# Patient Record
Sex: Female | Born: 1985 | Race: White | Hispanic: No | Marital: Single | Smoking: Never smoker
Health system: Southern US, Community
[De-identification: ages and names within clinical notes are randomized; demographics above are authoritative.]

## PROBLEM LIST (undated history)

## (undated) DIAGNOSIS — R269 Unspecified abnormalities of gait and mobility: Secondary | ICD-10-CM

## (undated) DIAGNOSIS — G43009 Migraine without aura, not intractable, without status migrainosus: Secondary | ICD-10-CM

## (undated) HISTORY — DX: Unspecified abnormalities of gait and mobility: R26.9

## (undated) HISTORY — PX: OTHER SURGICAL HISTORY: SHX169

## (undated) HISTORY — DX: Migraine without aura, not intractable, without status migrainosus: G43.009

## (undated) HISTORY — PX: NO PAST SURGERIES: SHX2092

---

## 2005-05-16 ENCOUNTER — Emergency Department (HOSPITAL_COMMUNITY): Admission: EM | Admit: 2005-05-16 | Discharge: 2005-05-16 | Payer: Self-pay | Admitting: Family Medicine

## 2014-01-21 ENCOUNTER — Encounter: Payer: Self-pay | Admitting: Neurology

## 2014-01-23 ENCOUNTER — Encounter (INDEPENDENT_AMBULATORY_CARE_PROVIDER_SITE_OTHER): Payer: Self-pay

## 2014-01-23 ENCOUNTER — Ambulatory Visit (INDEPENDENT_AMBULATORY_CARE_PROVIDER_SITE_OTHER): Payer: No Typology Code available for payment source | Admitting: Neurology

## 2014-01-23 ENCOUNTER — Encounter: Payer: Self-pay | Admitting: Neurology

## 2014-01-23 VITALS — BP 125/75 | HR 98 | Ht 64.0 in | Wt 147.0 lb

## 2014-01-23 DIAGNOSIS — R269 Unspecified abnormalities of gait and mobility: Secondary | ICD-10-CM | POA: Insufficient documentation

## 2014-01-23 DIAGNOSIS — R279 Unspecified lack of coordination: Secondary | ICD-10-CM

## 2014-01-23 DIAGNOSIS — R270 Ataxia, unspecified: Secondary | ICD-10-CM

## 2014-01-23 NOTE — Progress Notes (Signed)
Reason for visit: Gait ataxia  Karla Nichols is a 28 y.o. 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 The patient indicates that she has had some issues with walking, and multiple falls. The patient otherwise denies any numbness or weakness of extremities. The patient has not had any issues controlling the bowels or the bladder. The patient denies any double vision, loss of vision, hearing changes, or memory or concentration problems. The patient indicates that her father has a similar problem that has been gradually progressive. Her father has also been evaluated through this office, but blood work evaluations and MRI of the brain evaluation was never done on the father. The patient knows of no other family members that have been affected. The patient has no siblings. The patient comes to this office for further evaluation.  Past Medical History  Diagnosis Date  . Abnormality of gait   . Migraine without aura, without mention of intractable migraine without mention of status migrainosus     Past Surgical History  Procedure Laterality Date  . None      Family History  Problem Relation Age of Onset  . Hypertension Mother   . Diabetes Mother     Social history:  reports that she has never smoked. She has never used smokeless tobacco. She reports that she drinks alcohol. She reports that she does not use illicit drugs.  Medications:  Current Outpatient Prescriptions on File Prior to Visit  Medication Sig Dispense Refill  . cetirizine (ZYRTEC) 10 MG tablet Take 10 mg by mouth daily.       No current facility-administered medications on file prior to visit.     No Known Allergies  ROS:  Out of a complete 14 system review of symptoms, the patient complains only of the following symptoms, and all other reviewed systems are negative.  Gait  instability Allergies  Blood pressure 125/75, pulse 98, height 5\' 4"  (1.626 m), weight 147 lb (66.679 kg).  Physical Exam  General: The patient is alert and cooperative at the time of the examination.  Eyes: Pupils are equal, round, and reactive to light. Discs are flat bilaterally.  Neck: The neck is supple, no carotid bruits are noted.  Respiratory: The respiratory examination is clear.  Cardiovascular: The cardiovascular examination reveals a regular rate and rhythm, no obvious murmurs or rubs are noted.  Skin: Extremities are without significant edema.  Neurologic Exam  Mental status: The patient is alert and oriented x 3 at the time of the examination. The patient has apparent normal recent and remote memory, with an apparently normal attention span and concentration ability.  Cranial nerves: Facial symmetry is present. There is good sensation of the face to pinprick and soft touch bilaterally. The strength of the facial muscles and the muscles to head turning and shoulder shrug are normal bilaterally. Speech is abnormal, with an ataxic characteristic, no aphasia is noted. Extraocular movements are full, but the patient has prominent and gaze nystagmus. Visual fields are full. The tongue is midline, and the patient has symmetric elevation of the soft palate. No obvious hearing deficits are noted.  Motor: The motor testing reveals 5 over 5 strength of all 4 extremities. Good symmetric motor tone is noted throughout.  Sensory: Sensory testing is intact to pinprick, soft touch, and vibration sensation on all 4 extremities. Position sense is markedly  impaired on all 4 extremities. No evidence of extinction is noted.  Coordination: Cerebellar testing reveals good finger-nose-finger and heel-to-shin bilaterally.  Gait and station: Gait is wide-based, ataxic. Tandem gait is poor. Romberg is negative. No drift is seen.  Reflexes: Deep tendon reflexes are symmetric and normal  bilaterally. Toes are downgoing bilaterally.   Assessment/Plan:  1. 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 The clinical examination reveals evidence of posterior column dysfunction with significant position sense abnormalities, nystagmus, and a cerebellar speech pattern. SCA type 18 needs to be considered, but other forms of hereditary afferent ataxias should be considered such as ataxia with vitamin E deficiency (AVED), and Wilson's disease needs to be considered. The patient will be set up for MRI evaluation of the brain, and blood work today. Genetic testing may be offered. Unfortunately, insurance usually does not pay for genetic testing. 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 The offspring in the future for this patient may have a 50% chance of having the same disease. The patient will followup through this office if needed. The gait disorder is likely to be slowly progressive throughout her lifetime.  Marlan Palau. Keith Mahesh Sizemore MD 01/24/2014 5:41 PM  [facility] [address] [place], [place] [id number]  Phone [phone] Fax [phone]

## 2014-01-23 NOTE — Patient Instructions (Signed)

## 2016-06-08 ENCOUNTER — Encounter (HOSPITAL_COMMUNITY): Payer: Self-pay | Admitting: Emergency Medicine

## 2016-06-08 ENCOUNTER — Emergency Department (HOSPITAL_COMMUNITY)
Admission: EM | Admit: 2016-06-08 | Discharge: 2016-06-08 | Disposition: A | Discharge status: Home / Self Care | Payer: Self-pay | Attending: Dermatology | Admitting: Dermatology

## 2016-06-08 ENCOUNTER — Emergency Department (HOSPITAL_COMMUNITY): Payer: BLUE CROSS/BLUE SHIELD

## 2016-06-08 ENCOUNTER — Emergency Department (HOSPITAL_COMMUNITY)
Admission: EM | Admit: 2016-06-08 | Discharge: 2016-06-08 | Disposition: A | Discharge status: Home / Self Care | Payer: BLUE CROSS/BLUE SHIELD | Attending: Emergency Medicine | Admitting: Emergency Medicine

## 2016-06-08 DIAGNOSIS — S5292XA Unspecified fracture of left forearm, initial encounter for closed fracture: Secondary | ICD-10-CM

## 2016-06-08 DIAGNOSIS — Z5321 Procedure and treatment not carried out due to patient leaving prior to being seen by health care provider: Secondary | ICD-10-CM | POA: Insufficient documentation

## 2016-06-08 DIAGNOSIS — S52209A Unspecified fracture of shaft of unspecified ulna, initial encounter for closed fracture: Secondary | ICD-10-CM

## 2016-06-08 DIAGNOSIS — Y939 Activity, unspecified: Secondary | ICD-10-CM | POA: Insufficient documentation

## 2016-06-08 DIAGNOSIS — S4992XA Unspecified injury of left shoulder and upper arm, initial encounter: Secondary | ICD-10-CM | POA: Insufficient documentation

## 2016-06-08 DIAGNOSIS — Z791 Long term (current) use of non-steroidal anti-inflammatories (NSAID): Secondary | ICD-10-CM | POA: Insufficient documentation

## 2016-06-08 DIAGNOSIS — M79632 Pain in left forearm: Secondary | ICD-10-CM | POA: Insufficient documentation

## 2016-06-08 DIAGNOSIS — X58XXXA Exposure to other specified factors, initial encounter: Secondary | ICD-10-CM | POA: Insufficient documentation

## 2016-06-08 DIAGNOSIS — S52202A Unspecified fracture of shaft of left ulna, initial encounter for closed fracture: Secondary | ICD-10-CM

## 2016-06-08 DIAGNOSIS — Y999 Unspecified external cause status: Secondary | ICD-10-CM | POA: Insufficient documentation

## 2016-06-08 DIAGNOSIS — Y929 Unspecified place or not applicable: Secondary | ICD-10-CM | POA: Insufficient documentation

## 2016-06-08 DIAGNOSIS — S5290XA Unspecified fracture of unspecified forearm, initial encounter for closed fracture: Secondary | ICD-10-CM

## 2016-06-08 MED ORDER — HYDROCODONE-ACETAMINOPHEN 5-325 MG PO TABS
1.0000 | ORAL_TABLET | Freq: Once | ORAL | Status: AC
  Administered 2016-06-08: 1 via ORAL
  Filled 2016-06-08: qty 1
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
## 2016-06-08 NOTE — ED Notes (Signed)
Ice pack given to patient for left arm swelling.

## 2016-06-08 NOTE — Discharge Instructions (Signed)
Ice and elevation of your left arm for swelling. Take 3-4 over-the-counter Motrin 2 to 3 times a day with meals to help with pain and inflammation. Keep your splint clean and dry. Dr. Magnus IvanBlackman will call you to schedule surgery.  His number is [phone] [facility]
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
## 2016-06-10 ENCOUNTER — Encounter (HOSPITAL_COMMUNITY): Payer: Self-pay | Admitting: *Deleted

## 2016-06-10 NOTE — Progress Notes (Signed)
Patient denied chest pain, shortness of breath, cardiology visit. Patient verbalized understanding of all instructions.

## 2016-06-11 ENCOUNTER — Encounter (HOSPITAL_COMMUNITY): Payer: Self-pay | Admitting: Emergency Medicine

## 2016-06-13 ENCOUNTER — Ambulatory Visit (HOSPITAL_COMMUNITY): Payer: BLUE CROSS/BLUE SHIELD

## 2016-06-13 ENCOUNTER — Encounter (HOSPITAL_COMMUNITY): Payer: Self-pay | Admitting: *Deleted

## 2016-06-13 ENCOUNTER — Ambulatory Visit (HOSPITAL_COMMUNITY): Payer: BLUE CROSS/BLUE SHIELD | Admitting: Certified Registered Nurse Anesthetist

## 2016-06-13 ENCOUNTER — Encounter (HOSPITAL_COMMUNITY)
Admission: RE | Disposition: A | Discharge status: Home / Self Care | Payer: Self-pay | Source: Ambulatory Visit | Attending: Orthopaedic Surgery

## 2016-06-13 ENCOUNTER — Other Ambulatory Visit: Payer: Self-pay | Admitting: Orthopaedic Surgery

## 2016-06-13 ENCOUNTER — Observation Stay (HOSPITAL_COMMUNITY)
Admission: RE | Admit: 2016-06-13 | Discharge: 2016-06-14 | Disposition: A | Discharge status: Home / Self Care | Payer: BLUE CROSS/BLUE SHIELD | Source: Ambulatory Visit | Attending: Orthopaedic Surgery | Admitting: Orthopaedic Surgery

## 2016-06-13 DIAGNOSIS — S52209A Unspecified fracture of shaft of unspecified ulna, initial encounter for closed fracture: Secondary | ICD-10-CM | POA: Diagnosis present

## 2016-06-13 DIAGNOSIS — S52322A Displaced transverse fracture of shaft of left radius, initial encounter for closed fracture: Secondary | ICD-10-CM | POA: Diagnosis present

## 2016-06-13 DIAGNOSIS — S59092A Other physeal fracture of lower end of ulna, left arm, initial encounter for closed fracture: Secondary | ICD-10-CM | POA: Diagnosis not present

## 2016-06-13 DIAGNOSIS — S5290XA Unspecified fracture of unspecified forearm, initial encounter for closed fracture: Secondary | ICD-10-CM

## 2016-06-13 DIAGNOSIS — S52202A Unspecified fracture of shaft of left ulna, initial encounter for closed fracture: Secondary | ICD-10-CM

## 2016-06-13 DIAGNOSIS — S5292XA Unspecified fracture of left forearm, initial encounter for closed fracture: Secondary | ICD-10-CM

## 2016-06-13 HISTORY — PX: ORIF ULNAR FRACTURE: SHX5417

## 2016-06-13 HISTORY — DX: Unspecified abnormalities of gait and mobility: R26.9

## 2016-06-13 LAB — HCG, SERUM, QUALITATIVE: Preg, Serum: NEGATIVE

## 2016-06-13 SURGERY — OPEN REDUCTION INTERNAL FIXATION (ORIF) ULNAR FRACTURE
Anesthesia: Regional | Site: Hand | Laterality: Left

## 2016-06-13 MED ORDER — MIDAZOLAM HCL 2 MG/2ML IJ SOLN
INTRAMUSCULAR | Status: AC
  Administered 2016-06-13: 2 mg via INTRAVENOUS
  Filled 2016-06-13: qty 2

## 2016-06-13 MED ORDER — ONDANSETRON HCL 4 MG PO TABS: 4.0000 mg | ORAL_TABLET | Freq: Four times a day (QID) | ORAL | Status: DC | PRN

## 2016-06-13 MED ORDER — MIDAZOLAM HCL 2 MG/2ML IJ SOLN
2.0000 mg | Freq: Once | INTRAMUSCULAR | Status: AC
  Administered 2016-06-13: 2 mg via INTRAVENOUS

## 2016-06-13 MED ORDER — FENTANYL CITRATE (PF) 250 MCG/5ML IJ SOLN
INTRAMUSCULAR | Status: AC
  Filled 2016-06-13: qty 5

## 2016-06-13 MED ORDER — FENTANYL CITRATE (PF) 100 MCG/2ML IJ SOLN
INTRAMUSCULAR | Status: AC
  Administered 2016-06-13: 100 ug via INTRAVENOUS
  Filled 2016-06-13: qty 2

## 2016-06-13 MED ORDER — ONDANSETRON HCL 4 MG/2ML IJ SOLN
INTRAMUSCULAR | Status: DC | PRN
  Administered 2016-06-13: 4 mg via INTRAVENOUS

## 2016-06-13 MED ORDER — HYDROMORPHONE HCL 1 MG/ML IJ SOLN: 0.5000 mg | INTRAMUSCULAR | Status: DC | PRN

## 2016-06-13 MED ORDER — OXYCODONE HCL 5 MG PO TABS
5.0000 mg | ORAL_TABLET | ORAL | Status: DC | PRN
  Administered 2016-06-13 – 2016-06-14 (×2): 10 mg via ORAL
  Administered 2016-06-14: 5 mg via ORAL
  Filled 2016-06-13 (×3): qty 2

## 2016-06-13 MED ORDER — FENTANYL CITRATE (PF) 100 MCG/2ML IJ SOLN: 25.0000 ug | INTRAMUSCULAR | Status: DC | PRN

## 2016-06-13 MED ORDER — LACTATED RINGERS IV SOLN
INTRAVENOUS | Status: DC
  Administered 2016-06-13 (×3): via INTRAVENOUS

## 2016-06-13 MED ORDER — DIPHENHYDRAMINE HCL 12.5 MG/5ML PO ELIX: 12.5000 mg | ORAL_SOLUTION | ORAL | Status: DC | PRN

## 2016-06-13 MED ORDER — LIDOCAINE 2% (20 MG/ML) 5 ML SYRINGE
INTRAMUSCULAR | Status: AC
  Filled 2016-06-13: qty 5

## 2016-06-13 MED ORDER — FENTANYL CITRATE (PF) 100 MCG/2ML IJ SOLN
INTRAMUSCULAR | Status: DC | PRN
  Administered 2016-06-13: 50 ug via INTRAVENOUS
  Administered 2016-06-13: 25 ug via INTRAVENOUS

## 2016-06-13 MED ORDER — FENTANYL CITRATE (PF) 100 MCG/2ML IJ SOLN
100.0000 ug | Freq: Once | INTRAMUSCULAR | Status: AC
  Administered 2016-06-13: 100 ug via INTRAVENOUS

## 2016-06-13 MED ORDER — ROCURONIUM BROMIDE 50 MG/5ML IV SOLN
INTRAVENOUS | Status: AC
  Filled 2016-06-13: qty 2

## 2016-06-13 MED ORDER — 0.9 % SODIUM CHLORIDE (POUR BTL) OPTIME
TOPICAL | Status: DC | PRN
  Administered 2016-06-13: 1000 mL

## 2016-06-13 MED ORDER — LIDOCAINE HCL (CARDIAC) 20 MG/ML IV SOLN
INTRAVENOUS | Status: DC | PRN
  Administered 2016-06-13: 100 mg via INTRAVENOUS

## 2016-06-13 MED ORDER — ACETAMINOPHEN 325 MG PO TABS: 650.0000 mg | ORAL_TABLET | Freq: Four times a day (QID) | ORAL | Status: DC | PRN

## 2016-06-13 MED ORDER — MIDAZOLAM HCL 2 MG/2ML IJ SOLN
INTRAMUSCULAR | Status: AC
  Filled 2016-06-13: qty 2

## 2016-06-13 MED ORDER — CEFAZOLIN IN D5W 1 GM/50ML IV SOLN
1.0000 g | Freq: Four times a day (QID) | INTRAVENOUS | Status: AC
Start: 2016-06-13 — End: 2016-06-14
  Administered 2016-06-13 – 2016-06-14 (×3): 1 g via INTRAVENOUS
  Filled 2016-06-13 (×3): qty 50

## 2016-06-13 MED ORDER — DEXAMETHASONE SODIUM PHOSPHATE 4 MG/ML IJ SOLN
INTRAMUSCULAR | Status: DC | PRN
  Administered 2016-06-13: 10 mg via INTRAVENOUS

## 2016-06-13 MED ORDER — PROPOFOL 10 MG/ML IV BOLUS
INTRAVENOUS | Status: DC | PRN
  Administered 2016-06-13: 200 mg via INTRAVENOUS

## 2016-06-13 MED ORDER — METOCLOPRAMIDE HCL 5 MG/ML IJ SOLN: 5.0000 mg | Freq: Three times a day (TID) | INTRAMUSCULAR | Status: DC | PRN

## 2016-06-13 MED ORDER — ONDANSETRON HCL 4 MG/2ML IJ SOLN: 4.0000 mg | Freq: Four times a day (QID) | INTRAMUSCULAR | Status: DC | PRN

## 2016-06-13 MED ORDER — CEFAZOLIN SODIUM-DEXTROSE 2-4 GM/100ML-% IV SOLN
2.0000 g | INTRAVENOUS | Status: AC
  Administered 2016-06-13: 2 g via INTRAVENOUS

## 2016-06-13 MED ORDER — CEFAZOLIN SODIUM-DEXTROSE 2-4 GM/100ML-% IV SOLN
INTRAVENOUS | Status: AC
  Filled 2016-06-13: qty 100

## 2016-06-13 MED ORDER — KETOROLAC TROMETHAMINE 15 MG/ML IJ SOLN
7.5000 mg | Freq: Four times a day (QID) | INTRAMUSCULAR | Status: DC
  Administered 2016-06-13: 7.5 mg via INTRAVENOUS
  Filled 2016-06-13: qty 1

## 2016-06-13 MED ORDER — METHOCARBAMOL 1000 MG/10ML IJ SOLN
500.0000 mg | Freq: Four times a day (QID) | INTRAVENOUS | Status: DC | PRN
  Filled 2016-06-13: qty 5

## 2016-06-13 MED ORDER — LORATADINE 10 MG PO TABS
10.0000 mg | ORAL_TABLET | Freq: Every day | ORAL | Status: DC
  Administered 2016-06-14: 10 mg via ORAL
  Filled 2016-06-13: qty 1

## 2016-06-13 MED ORDER — BUPIVACAINE-EPINEPHRINE (PF) 0.5% -1:200000 IJ SOLN
INTRAMUSCULAR | Status: DC | PRN
  Administered 2016-06-13: 25 mL

## 2016-06-13 MED ORDER — ACETAMINOPHEN 650 MG RE SUPP: 650.0000 mg | Freq: Four times a day (QID) | RECTAL | Status: DC | PRN

## 2016-06-13 MED ORDER — SODIUM CHLORIDE 0.9 % IV SOLN: INTRAVENOUS | Status: DC

## 2016-06-13 MED ORDER — METOCLOPRAMIDE HCL 5 MG PO TABS: 5.0000 mg | ORAL_TABLET | Freq: Three times a day (TID) | ORAL | Status: DC | PRN

## 2016-06-13 MED ORDER — METHOCARBAMOL 500 MG PO TABS
500.0000 mg | ORAL_TABLET | Freq: Four times a day (QID) | ORAL | Status: DC | PRN
Start: 2016-06-13 — End: 2016-06-14

## 2016-06-13 MED ORDER — PROMETHAZINE HCL 25 MG/ML IJ SOLN: 6.2500 mg | INTRAMUSCULAR | Status: DC | PRN

## 2016-06-13 SURGICAL SUPPLY — 68 items
BANDAGE ACE 3X5.8 VEL STRL LF (GAUZE/BANDAGES/DRESSINGS) ×3 IMPLANT
BANDAGE ACE 4X5 VEL STRL LF (GAUZE/BANDAGES/DRESSINGS) ×3 IMPLANT
BANDAGE ELASTIC 4 VELCRO ST LF (GAUZE/BANDAGES/DRESSINGS) ×3 IMPLANT
BIT DRILL 2.6 (BIT) ×3 IMPLANT
BNDG ESMARK 4X9 LF (GAUZE/BANDAGES/DRESSINGS) ×3 IMPLANT
BNDG GAUZE ELAST 4 BULKY (GAUZE/BANDAGES/DRESSINGS) ×3 IMPLANT
CLOSURE STERI-STRIP 1/2X4 (GAUZE/BANDAGES/DRESSINGS) ×1
CLOSURE WOUND 1/2 X4 (GAUZE/BANDAGES/DRESSINGS) ×1
CLSR STERI-STRIP ANTIMIC 1/2X4 (GAUZE/BANDAGES/DRESSINGS) ×2 IMPLANT
CORDS BIPOLAR (ELECTRODE) ×3 IMPLANT
COVER SURGICAL LIGHT HANDLE (MISCELLANEOUS) ×3 IMPLANT
CUFF TOURNIQUET SINGLE 18IN (TOURNIQUET CUFF) ×3 IMPLANT
CUFF TOURNIQUET SINGLE 24IN (TOURNIQUET CUFF) IMPLANT
DRAPE OEC MINIVIEW 54X84 (DRAPES) ×3 IMPLANT
DRAPE U-SHAPE 47X51 STRL (DRAPES) ×3 IMPLANT
DURAPREP 26ML APPLICATOR (WOUND CARE) ×3 IMPLANT
ELECT REM PT RETURN 9FT ADLT (ELECTROSURGICAL) ×3
ELECTRODE REM PT RTRN 9FT ADLT (ELECTROSURGICAL) ×1 IMPLANT
GAUZE SPONGE 4X4 12PLY STRL (GAUZE/BANDAGES/DRESSINGS) ×3 IMPLANT
GAUZE XEROFORM 1X8 LF (GAUZE/BANDAGES/DRESSINGS) ×3 IMPLANT
GLOVE BIO SURGEON STRL SZ 6.5 (GLOVE) ×4 IMPLANT
GLOVE BIO SURGEON STRL SZ8 (GLOVE) ×3 IMPLANT
GLOVE BIO SURGEONS STRL SZ 6.5 (GLOVE) ×2
GLOVE BIOGEL PI IND STRL 6.5 (GLOVE) ×2 IMPLANT
GLOVE BIOGEL PI IND STRL 8 (GLOVE) ×1 IMPLANT
GLOVE BIOGEL PI INDICATOR 6.5 (GLOVE) ×4
GLOVE BIOGEL PI INDICATOR 8 (GLOVE) ×2
GLOVE ORTHO TXT STRL SZ7.5 (GLOVE) ×3 IMPLANT
GOWN STRL REUS W/ TWL LRG LVL3 (GOWN DISPOSABLE) ×1 IMPLANT
GOWN STRL REUS W/ TWL XL LVL3 (GOWN DISPOSABLE) ×4 IMPLANT
GOWN STRL REUS W/TWL LRG LVL3 (GOWN DISPOSABLE) ×2
GOWN STRL REUS W/TWL XL LVL3 (GOWN DISPOSABLE) ×8
KIT BASIN OR (CUSTOM PROCEDURE TRAY) ×3 IMPLANT
KIT ROOM TURNOVER OR (KITS) ×3 IMPLANT
MANIFOLD NEPTUNE II (INSTRUMENTS) ×3 IMPLANT
NEEDLE 22X1 1/2 (OR ONLY) (NEEDLE) IMPLANT
NS IRRIG 1000ML POUR BTL (IV SOLUTION) ×3 IMPLANT
PACK ORTHO EXTREMITY (CUSTOM PROCEDURE TRAY) ×3 IMPLANT
PAD ARMBOARD 7.5X6 YLW CONV (MISCELLANEOUS) ×6 IMPLANT
PAD CAST 4YDX4 CTTN HI CHSV (CAST SUPPLIES) ×1 IMPLANT
PADDING CAST COTTON 4X4 STRL (CAST SUPPLIES) ×2
PLATE COMP NARROW STRT 6H 78MM (Plate) ×3 IMPLANT
PLATE COMP NARROW STRT 7H/90MM (Plate) ×3 IMPLANT
SCREW BONE 3.5X12 (Screw) ×15 IMPLANT
SCREW BONE 3.5X14MM (Screw) ×21 IMPLANT
SCREW BONE 3.5X16MM (Screw) ×3 IMPLANT
SPLINT FIBERGLASS 3X35 (CAST SUPPLIES) ×3 IMPLANT
SPONGE GAUZE 4X4 12PLY STER LF (GAUZE/BANDAGES/DRESSINGS) ×3 IMPLANT
SPONGE LAP 4X18 X RAY DECT (DISPOSABLE) ×3 IMPLANT
STRIP CLOSURE SKIN 1/2X4 (GAUZE/BANDAGES/DRESSINGS) ×2 IMPLANT
SUCTION FRAZIER HANDLE 10FR (MISCELLANEOUS) ×2
SUCTION TUBE FRAZIER 10FR DISP (MISCELLANEOUS) ×1 IMPLANT
SUT ETHILON 3 0 PS 1 (SUTURE) ×9 IMPLANT
SUT PROLENE 3 0 PS 1 (SUTURE) ×3 IMPLANT
SUT VIC AB 0 CT1 27 (SUTURE) ×4
SUT VIC AB 0 CT1 27XBRD ANBCTR (SUTURE) ×2 IMPLANT
SUT VIC AB 2-0 CT1 27 (SUTURE) ×4
SUT VIC AB 2-0 CT1 TAPERPNT 27 (SUTURE) ×2 IMPLANT
SUT VIC AB 3-0 X1 27 (SUTURE) ×3 IMPLANT
SUT VICRYL 4-0 PS2 18IN ABS (SUTURE) ×3 IMPLANT
SYR CONTROL 10ML LL (SYRINGE) IMPLANT
SYSTEM CHEST DRAIN TLS 7FR (DRAIN) ×3 IMPLANT
TOWEL OR 17X24 6PK STRL BLUE (TOWEL DISPOSABLE) ×3 IMPLANT
TOWEL OR 17X26 10 PK STRL BLUE (TOWEL DISPOSABLE) ×3 IMPLANT
TUBE CONNECTING 12'X1/4 (SUCTIONS) ×1
TUBE CONNECTING 12X1/4 (SUCTIONS) ×2 IMPLANT
UNDERPAD 30X30 INCONTINENT (UNDERPADS AND DIAPERS) ×3 IMPLANT
WATER STERILE IRR 1000ML POUR (IV SOLUTION) ×3 IMPLANT
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
## 2016-06-13 NOTE — Anesthesia Procedure Notes (Signed)
Procedure Name: LMA Insertion Date/Time: 06/13/2016 3:37 PM Performed by: Reine Just Pre-anesthesia Checklist: Patient identified, Emergency Drugs available, Suction available, Patient being monitored and Timeout performed Patient Re-evaluated:Patient Re-evaluated prior to inductionOxygen Delivery Method: Circle system utilized and Simple face mask Preoxygenation: Pre-oxygenation with 100% oxygen Intubation Type: IV induction Ventilation: Mask ventilation without difficulty LMA: LMA inserted LMA Size: 4.0 Number of attempts: 1 Airway Equipment and Method: Patient positioned with wedge pillow Placement Confirmation: breath sounds checked- equal and bilateral and positive ETCO2 Tube secured with: Tape Dental Injury: Teeth and Oropharynx as per pre-operative assessment

## 2016-06-13 NOTE — Anesthesia Procedure Notes (Signed)
Anesthesia Regional Block:  Supraclavicular block  Pre-Anesthetic Checklist: ,, timeout performed, Correct Patient, Correct Site, Correct Laterality, Correct Procedure, Correct Position, site marked, Risks and benefits discussed,  Surgical consent,  Pre-op evaluation,  At surgeon's request and post-op pain management  Laterality: Left  Prep: chloraprep       Needles:  Injection technique: Single-shot  Needle Type: Stimiplex     Needle Length: 9cm 9 cm Needle Gauge: 21 G    Additional Needles:  Procedures: ultrasound guided (picture in chart) Supraclavicular block Narrative:  Injection made incrementally with aspirations every 5 mL.  Performed by: Personally  Anesthesiologist: Chamberlain Steinborn  Additional Notes: Risks, benefits and alternative to block explained extensively.  Patient tolerated procedure well, without complications.      

## 2016-06-13 NOTE — Anesthesia Preprocedure Evaluation (Signed)
Anesthesia Evaluation  Patient identified by MRN, date of birth, ID band Patient awake    Reviewed: Allergy & Precautions, H&P , NPO status , Patient's Chart, lab work & pertinent test results  History of Anesthesia Complications Negative for: history of anesthetic complications  Airway Mallampati: II  TM Distance: >3 FB Neck ROM: full    Dental no notable dental hx.    Pulmonary neg pulmonary ROS,    Pulmonary exam normal breath sounds clear to auscultation       Cardiovascular negative cardio ROS Normal cardiovascular exam Rhythm:regular Rate:Normal     Neuro/Psych  Headaches,    GI/Hepatic negative GI ROS, Neg liver ROS,   Endo/Other  negative endocrine ROS  Renal/GU negative Renal ROS     Musculoskeletal   Abdominal   Peds  Hematology negative hematology ROS (+)   Anesthesia Other Findings   Reproductive/Obstetrics negative OB ROS                             Anesthesia Physical Anesthesia Plan  ASA: II  Anesthesia Plan: General and Regional   Post-op Pain Management:    Induction: Intravenous  Airway Management Planned: LMA  Additional Equipment:   Intra-op Plan:   Post-operative Plan: Extubation in OR  Informed Consent: I have reviewed the patients History and Physical, chart, labs and discussed the procedure including the risks, benefits and alternatives for the proposed anesthesia with the patient or authorized representative who has indicated his/her understanding and acceptance.   Dental Advisory Given  Plan Discussed with: Anesthesiologist, CRNA and Surgeon  Anesthesia Plan Comments:         Anesthesia Quick Evaluation
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
## 2016-06-13 NOTE — Transfer of Care (Signed)
Immediate Anesthesia Transfer of Care Note  Patient: Karla Nichols  Procedure(s) Performed: Procedure(s): OPEN REDUCTION INTERNAL FIXATION (ORIF) LEFT RADIUS AND ULNA FRACTURES (Left)  Patient Location: PACU  Anesthesia Type:GA combined with regional for post-op pain  Level of Consciousness: awake and alert   Airway & Oxygen Therapy: Patient Spontanous Breathing and Patient connected to nasal cannula oxygen  Post-op Assessment: Report given to RN and Post -op Vital signs reviewed and stable  Post vital signs: Reviewed and stable  Last Vitals:  Vitals:   06/13/16 1446 06/13/16 1447  BP: 139/68   Pulse: (!) 108 (!) 114  Resp: 14 19  Temp:      Last Pain:  Vitals:   06/13/16 1311  TempSrc: Oral         Complications: No apparent anesthesia complications

## 2016-06-14 ENCOUNTER — Encounter (HOSPITAL_COMMUNITY): Payer: Self-pay | Admitting: Orthopaedic Surgery

## 2016-06-14 DIAGNOSIS — S52322A Displaced transverse fracture of shaft of left radius, initial encounter for closed fracture: Secondary | ICD-10-CM | POA: Diagnosis not present

## 2016-06-14 MED ORDER — KETOROLAC TROMETHAMINE 15 MG/ML IJ SOLN: 7.5000 mg | Freq: Four times a day (QID) | INTRAMUSCULAR | Status: DC

## 2016-06-14 MED ORDER — OXYCODONE-ACETAMINOPHEN 5-325 MG PO TABS: 1.0000 | ORAL_TABLET | ORAL | 0 refills | Status: DC | PRN

## 2016-06-14 NOTE — Progress Notes (Signed)
Orthopedic Tech Progress Note Patient Details:  Karla Nichols 02-05-1986 [id number]  Ortho Devices Type of Ortho Device: Velcro wrist forearm splint Ortho Device/Splint Location: lue Ortho Device/Splint Interventions: Application   Maalik Pinn 06/14/2016, 9:06 AM

## 2016-06-14 NOTE — Progress Notes (Signed)
Patient ID: Karla Nichols, female   DOB: February 28, 1986, 30 y.o.   MRN: [id number] Doing well.  Can be discharged to home today.  Will need velcro forearm splint

## 2016-06-14 NOTE — Progress Notes (Signed)
Discharge instructions given. Pt verbalized understanding and all questions were answered.  
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
## 2016-06-14 NOTE — Discharge Instructions (Signed)
Use your left arm as comfort allows. You can get your current dressing wet daily in the shower. Wear your velcro arm splint through out the day, but remove it to shower. No lifting greater than 5 lbs with your left arm.

## 2016-06-15 NOTE — Anesthesia Postprocedure Evaluation (Signed)
Anesthesia Post Note  Patient: Karla Nichols  Procedure(s) Performed: Procedure(s) (LRB): OPEN REDUCTION INTERNAL FIXATION (ORIF) LEFT RADIUS AND ULNA FRACTURES (Left)  Patient location during evaluation: PACU Anesthesia Type: General and Regional Level of consciousness: awake Pain management: pain level controlled Vital Signs Assessment: post-procedure vital signs reviewed and stable Respiratory status: spontaneous breathing Cardiovascular status: stable Postop Assessment: no signs of nausea or vomiting Anesthetic complications: no    Last Vitals:  Vitals:   06/13/16 2134 06/14/16 1300  BP: (!) 100/52 (!) 143/67  Pulse: (!) 110 89  Resp: 16 16  Temp: 37.2 C 37.3 C    Last Pain:  Vitals:   06/14/16 1300  TempSrc: Oral  PainSc:                  Furious Chiarelli

## 2016-08-24 ENCOUNTER — Ambulatory Visit (INDEPENDENT_AMBULATORY_CARE_PROVIDER_SITE_OTHER): Payer: BLUE CROSS/BLUE SHIELD | Admitting: Orthopaedic Surgery

## 2016-08-24 DIAGNOSIS — S52225D Nondisplaced transverse fracture of shaft of left ulna, subsequent encounter for closed fracture with routine healing: Secondary | ICD-10-CM

## 2016-09-21 ENCOUNTER — Ambulatory Visit (INDEPENDENT_AMBULATORY_CARE_PROVIDER_SITE_OTHER): Payer: BLUE CROSS/BLUE SHIELD | Admitting: Orthopaedic Surgery

## 2016-09-21 ENCOUNTER — Ambulatory Visit (INDEPENDENT_AMBULATORY_CARE_PROVIDER_SITE_OTHER): Payer: BLUE CROSS/BLUE SHIELD

## 2016-09-21 DIAGNOSIS — S52225D Nondisplaced transverse fracture of shaft of left ulna, subsequent encounter for closed fracture with routine healing: Secondary | ICD-10-CM

## 2016-09-21 NOTE — Progress Notes (Signed)
   Office Visit Note   Patient: Karla Nichols           Date of Birth: 12/19/1985           MRN: [id number] Visit Date: 09/21/2016              Requested by: Farris HasAaron [place], MD [address] [place], [place] [address] PCP: Farris HasMORROW, AARON, MD   Assessment & Plan: Visit Diagnoses:  1. Closed nondisplaced transverse fracture of shaft of left ulna with routine healing     Plan: She is almost essentially pain-free. She is return to work duties without restrictions starting November 20. She doesn't lift anything greater than 25 pounds anyway at work. I would like to see her back in 3 months with repeat AP lateral of her left forearm given the fact that I still see a small gap in the fractured radius.  Follow-Up Instructions: Return in about 3 months (around 12/22/2016).   Orders:  Orders Placed This Encounter  Procedures  . XR Forearm Left   No orders of the defined types were placed in this encounter.     Procedures: No procedures performed   Clinical Data: No additional findings.   Subjective: Chief Complaint  Patient presents with  . Left Wrist - Follow-up    Followup ORIF Left BB Forearm fracture. Patient is doing better, ROM and strength is increasing.    HPI  Review of Systems   Objective: Vital Signs: There were no vitals taken for this visit.  Physical Exam  Ortho Exam She has full range of motion of her left elbow and left wrist. There is no significant pain at the fracture site at all. Specialty Comments:  No specialty comments available.  Imaging: Xr Forearm Left  Result Date: 09/21/2016 AP and lateral left forearm shows the hardware is intact. There is no evidence of loosening or hardware failure. The ulnar fracture is almost completely healed. The radius fracture still shows a slight gap in the fracture.    PMFS History: Patient Active Problem List   Diagnosis Date Noted  . Forearm fractures, both bones, closed 06/08/2016  .  Abnormality of gait 01/23/2014   Past Medical History:  Diagnosis Date  . Abnormality of gait   . Gait abnormality   . Migraine without aura, without mention of intractable migraine without mention of status migrainosus     Family History  Problem Relation Age of Onset  . Hypertension Mother   . Diabetes Mother     Past Surgical History:  Procedure Laterality Date  . NO PAST SURGERIES    . none    . ORIF ULNAR FRACTURE Left 06/13/2016   Procedure: OPEN REDUCTION INTERNAL FIXATION (ORIF) LEFT RADIUS AND ULNA FRACTURES;  Surgeon: Kathryne Hitchhristopher Y Blackman, MD;  Location: [facility] OR;  Service: Orthopedics;  Laterality: Left;   Social History   Occupational History  . cashier [employer]   Social History Main Topics  . Smoking status: Never Smoker  . Smokeless tobacco: Never Used  . Alcohol use No     Comment: occassional  . Drug use: No  . Sexual activity: Yes    Birth control/ protection: None

## 2016-12-25 ENCOUNTER — Ambulatory Visit (INDEPENDENT_AMBULATORY_CARE_PROVIDER_SITE_OTHER): Payer: BLUE CROSS/BLUE SHIELD | Admitting: Orthopaedic Surgery

## 2016-12-25 ENCOUNTER — Encounter (INDEPENDENT_AMBULATORY_CARE_PROVIDER_SITE_OTHER): Payer: Self-pay | Admitting: Orthopaedic Surgery

## 2016-12-25 ENCOUNTER — Ambulatory Visit (INDEPENDENT_AMBULATORY_CARE_PROVIDER_SITE_OTHER): Payer: Self-pay

## 2016-12-25 DIAGNOSIS — S52202D Unspecified fracture of shaft of left ulna, subsequent encounter for closed fracture with routine healing: Secondary | ICD-10-CM | POA: Diagnosis not present

## 2016-12-25 DIAGNOSIS — S5292XD Unspecified fracture of left forearm, subsequent encounter for closed fracture with routine healing: Secondary | ICD-10-CM | POA: Diagnosis not present

## 2016-12-25 NOTE — Progress Notes (Signed)
The patient is now 6 months status post open reduction internal fixation of a left both bone forearm fracture. I want to see her back to regular concerned about the fact that the bone was appearing to not a field completely at her 3 month follow-up. She is now 6 months out from this injury and surgery. She denies any pain in her left forearm at all.  On examination of left forearm her scars are prominent from her fair complexion but overall the range of motion of her left elbow and left wrist are entirely full. She has 5 out of 5 strength of wrist flexion and wrist extension as well as excellent grip strength. She denies any pain when I stress the fracture site at all. He can palpate the edge of the radial plate.  X-rays of the forearm on the left side are reviewed and compared to previous films. The AP view can still see lucency at the fracture but a lateral view to see that is continue to consolidate and is no evidence of failure of the plate and there is no loosening around the screws at all.  At this point based on clinical exam she is doing excellent. At this point I would not do anything different since she is completely pain-free. However she has any issues with the forearm in any way she will not hesitate to let us know and can always follow-up any time.

## 2017-06-08 ENCOUNTER — Emergency Department (HOSPITAL_COMMUNITY): Payer: BLUE CROSS/BLUE SHIELD

## 2017-06-08 ENCOUNTER — Encounter (HOSPITAL_COMMUNITY): Payer: Self-pay | Admitting: Emergency Medicine

## 2017-06-08 ENCOUNTER — Inpatient Hospital Stay (HOSPITAL_COMMUNITY)
Admission: EM | Admit: 2017-06-08 | Discharge: 2017-06-11 | DRG: 512 | Disposition: A | Discharge status: Home / Self Care | Payer: BLUE CROSS/BLUE SHIELD | Attending: Specialist | Admitting: Specialist

## 2017-06-08 DIAGNOSIS — T148XXA Other injury of unspecified body region, initial encounter: Secondary | ICD-10-CM

## 2017-06-08 DIAGNOSIS — R270 Ataxia, unspecified: Secondary | ICD-10-CM | POA: Diagnosis present

## 2017-06-08 DIAGNOSIS — W010XXA Fall on same level from slipping, tripping and stumbling without subsequent striking against object, initial encounter: Secondary | ICD-10-CM | POA: Diagnosis present

## 2017-06-08 DIAGNOSIS — S52122A Displaced fracture of head of left radius, initial encounter for closed fracture: Secondary | ICD-10-CM | POA: Diagnosis not present

## 2017-06-08 DIAGNOSIS — S52132B Displaced fracture of neck of left radius, initial encounter for open fracture type I or II: Secondary | ICD-10-CM | POA: Diagnosis present

## 2017-06-08 DIAGNOSIS — Z8249 Family history of ischemic heart disease and other diseases of the circulatory system: Secondary | ICD-10-CM

## 2017-06-08 DIAGNOSIS — S52272B Monteggia's fracture of left ulna, initial encounter for open fracture type I or II: Secondary | ICD-10-CM | POA: Diagnosis not present

## 2017-06-08 DIAGNOSIS — W19XXXA Unspecified fall, initial encounter: Secondary | ICD-10-CM

## 2017-06-08 DIAGNOSIS — M79632 Pain in left forearm: Secondary | ICD-10-CM | POA: Diagnosis present

## 2017-06-08 DIAGNOSIS — Z833 Family history of diabetes mellitus: Secondary | ICD-10-CM

## 2017-06-08 DIAGNOSIS — S52122B Displaced fracture of head of left radius, initial encounter for open fracture type I or II: Secondary | ICD-10-CM | POA: Diagnosis present

## 2017-06-08 DIAGNOSIS — S52202B Unspecified fracture of shaft of left ulna, initial encounter for open fracture type I or II: Secondary | ICD-10-CM | POA: Diagnosis present

## 2017-06-08 DIAGNOSIS — S5292XB Unspecified fracture of left forearm, initial encounter for open fracture type I or II: Secondary | ICD-10-CM

## 2017-06-08 DIAGNOSIS — S52002B Unspecified fracture of upper end of left ulna, initial encounter for open fracture type I or II: Secondary | ICD-10-CM | POA: Diagnosis present

## 2017-06-08 DIAGNOSIS — Y92008 Other place in unspecified non-institutional (private) residence as the place of occurrence of the external cause: Secondary | ICD-10-CM

## 2017-06-08 DIAGNOSIS — Z9889 Other specified postprocedural states: Secondary | ICD-10-CM

## 2017-06-08 LAB — CBC WITH DIFFERENTIAL/PLATELET
BASOS ABS: 0 10*3/uL (ref 0.0–0.1)
BASOS PCT: 0 %
Eosinophils Absolute: 0 10*3/uL (ref 0.0–0.7)
Eosinophils Relative: 0 %
HEMATOCRIT: 37 % (ref 36.0–46.0)
HEMOGLOBIN: 12.4 g/dL (ref 12.0–15.0)
Lymphocytes Relative: 12 %
Lymphs Abs: 1.7 10*3/uL (ref 0.7–4.0)
MCH: 27.4 pg (ref 26.0–34.0)
MCHC: 33.5 g/dL (ref 30.0–36.0)
MCV: 81.9 fL (ref 78.0–100.0)
Monocytes Absolute: 0.6 10*3/uL (ref 0.1–1.0)
Monocytes Relative: 4 %
NEUTROS ABS: 11.9 10*3/uL — AB (ref 1.7–7.7)
NEUTROS PCT: 84 %
Platelets: 301 10*3/uL (ref 150–400)
RBC: 4.52 MIL/uL (ref 3.87–5.11)
RDW: 13.1 % (ref 11.5–15.5)
WBC: 14.2 10*3/uL — ABNORMAL HIGH (ref 4.0–10.5)

## 2017-06-08 LAB — BASIC METABOLIC PANEL
ANION GAP: 12 (ref 5–15)
BUN: 11 mg/dL (ref 6–20)
CALCIUM: 9.2 mg/dL (ref 8.9–10.3)
CO2: 24 mmol/L (ref 22–32)
Chloride: 103 mmol/L (ref 101–111)
Creatinine, Ser: 0.75 mg/dL (ref 0.44–1.00)
GFR calc Af Amer: >60 mL/min (ref >60)
Glucose, Bld: 103 mg/dL — ABNORMAL HIGH (ref 65–99)
POTASSIUM: 3.8 mmol/L (ref 3.5–5.1)
Sodium: 139 mmol/L (ref 135–145)

## 2017-06-08 LAB — I-STAT BETA HCG BLOOD, ED (NOT ORDERABLE): I-stat hCG, quantitative: <5 m[IU]/mL (ref <5)

## 2017-06-08 MED ORDER — MORPHINE SULFATE (PF) 4 MG/ML IV SOLN: 4.0000 mg | Freq: Once | INTRAVENOUS | Status: DC

## 2017-06-08 MED ORDER — METHOCARBAMOL 1000 MG/10ML IJ SOLN: 500.0000 mg | Freq: Four times a day (QID) | INTRAVENOUS | Status: DC | PRN

## 2017-06-08 MED ORDER — TETANUS-DIPHTH-ACELL PERTUSSIS 5-2.5-18.5 LF-MCG/0.5 IM SUSP
0.5000 mL | Freq: Once | INTRAMUSCULAR | Status: AC
  Administered 2017-06-08: 0.5 mL via INTRAMUSCULAR
  Filled 2017-06-08: qty 0.5

## 2017-06-08 MED ORDER — MORPHINE SULFATE (PF) 2 MG/ML IV SOLN: 1.0000 mg | INTRAVENOUS | Status: DC | PRN

## 2017-06-08 MED ORDER — BISACODYL 5 MG PO TBEC: 5.0000 mg | DELAYED_RELEASE_TABLET | Freq: Every day | ORAL | Status: DC | PRN

## 2017-06-08 MED ORDER — PANTOPRAZOLE SODIUM 40 MG PO TBEC
40.0000 mg | DELAYED_RELEASE_TABLET | Freq: Two times a day (BID) | ORAL | Status: DC | PRN
  Administered 2017-06-11: 09:00:00 40 mg via ORAL
  Filled 2017-06-08: qty 1

## 2017-06-08 MED ORDER — MORPHINE SULFATE (PF) 2 MG/ML IV SOLN
INTRAVENOUS | Status: AC
  Administered 2017-06-08: 4 mg via INTRAVENOUS
  Filled 2017-06-08: qty 2

## 2017-06-08 MED ORDER — POTASSIUM CHLORIDE IN NACL 20-0.45 MEQ/L-% IV SOLN
INTRAVENOUS | Status: DC
  Administered 2017-06-09 – 2017-06-10 (×3): via INTRAVENOUS
  Filled 2017-06-08 (×4): qty 1000

## 2017-06-08 MED ORDER — ASPIRIN 325 MG PO TABS
325.0000 mg | ORAL_TABLET | Freq: Two times a day (BID) | ORAL | Status: DC
  Administered 2017-06-09 – 2017-06-11 (×5): 325 mg via ORAL
  Filled 2017-06-08 (×5): qty 1

## 2017-06-08 MED ORDER — ONDANSETRON HCL 4 MG/2ML IJ SOLN
4.0000 mg | Freq: Four times a day (QID) | INTRAMUSCULAR | Status: DC | PRN
  Administered 2017-06-09: 4 mg via INTRAVENOUS

## 2017-06-08 MED ORDER — CEFAZOLIN SODIUM-DEXTROSE 1-4 GM/50ML-% IV SOLN
1.0000 g | Freq: Three times a day (TID) | INTRAVENOUS | Status: DC
  Administered 2017-06-09 – 2017-06-11 (×7): 1 g via INTRAVENOUS
  Filled 2017-06-08 (×9): qty 50

## 2017-06-08 MED ORDER — DOCUSATE SODIUM 100 MG PO CAPS
100.0000 mg | ORAL_CAPSULE | Freq: Two times a day (BID) | ORAL | Status: DC
  Administered 2017-06-09 – 2017-06-11 (×5): 100 mg via ORAL
  Filled 2017-06-08 (×5): qty 1

## 2017-06-08 MED ORDER — CEFAZOLIN SODIUM-DEXTROSE 1-4 GM/50ML-% IV SOLN
1.0000 g | Freq: Once | INTRAVENOUS | Status: AC
  Administered 2017-06-08: 1 g via INTRAVENOUS
  Filled 2017-06-08: qty 50

## 2017-06-08 MED ORDER — METHOCARBAMOL 500 MG PO TABS
500.0000 mg | ORAL_TABLET | Freq: Four times a day (QID) | ORAL | Status: DC | PRN
  Administered 2017-06-09 – 2017-06-11 (×4): 500 mg via ORAL
  Filled 2017-06-08 (×4): qty 1

## 2017-06-08 MED ORDER — GENTAMICIN SULFATE 40 MG/ML IJ SOLN
5.0000 mg/kg | INTRAVENOUS | Status: DC
  Administered 2017-06-09 (×2): 290 mg via INTRAVENOUS
  Filled 2017-06-08: qty 7.25

## 2017-06-08 MED ORDER — MORPHINE SULFATE (PF) 4 MG/ML IV SOLN
4.0000 mg | Freq: Once | INTRAVENOUS | Status: AC
  Administered 2017-06-08: 4 mg via INTRAVENOUS
  Filled 2017-06-08: qty 1

## 2017-06-08 MED ORDER — POLYETHYLENE GLYCOL 3350 17 G PO PACK: 17.0000 g | PACK | Freq: Every day | ORAL | Status: DC | PRN

## 2017-06-08 MED ORDER — CHLORPROMAZINE HCL 25 MG PO TABS
25.0000 mg | ORAL_TABLET | Freq: Three times a day (TID) | ORAL | Status: DC | PRN
  Filled 2017-06-08: qty 1

## 2017-06-08 MED ORDER — ONDANSETRON HCL 4 MG PO TABS: 4.0000 mg | ORAL_TABLET | Freq: Four times a day (QID) | ORAL | Status: DC | PRN

## 2017-06-08 MED ORDER — FLEET ENEMA 7-19 GM/118ML RE ENEM: 1.0000 | ENEMA | Freq: Once | RECTAL | Status: DC | PRN
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
## 2017-06-08 NOTE — ED Triage Notes (Signed)
Patient fell and tried to catch herself. Patient put a weight on her arm and it felt like it broke per patient. Patient arm is clicking when you move her arm. Patient has hardware in arm from a previous arm break.

## 2017-06-08 NOTE — ED Provider Notes (Signed)
[facility]-EMERGENCY DEPT Provider Note   CSN: [id number] Arrival date & time: 06/08/17  1920     History   Chief Complaint Chief Complaint  Patient presents with  . Arm Injury    HPI Karla Nichols is a 31 y.o. female who presents With acute onset left arm pain after a mechanical fall. She has pain to her left proximal forearm with skin break and bleeding.  She fell exactly one year ago to date fracturing both her ulnar and radius which required ORIF and plating.  She denies any numbness or tingling. States that when her arm moves she can feel a clicking.  She reports that she is otherwise healthy. Was feeling well or to her fall.  She reports that her arm was initially "more crooked" and that she straightened it out after she fell.  She reports that she has a bleeding wound on the posterior aspect of her proximal forearm where her arm was initially bent when she fell.    HPI  Past Medical History:  Diagnosis Date  . Abnormality of gait   . Gait abnormality   . Migraine without aura, without mention of intractable migraine without mention of status migrainosus     Patient Active Problem List   Diagnosis Date Noted  . Forearm fractures, both bones, closed 06/08/2016  . Abnormality of gait 01/23/2014    Past Surgical History:  Procedure Laterality Date  . NO PAST SURGERIES    . none    . ORIF ULNAR FRACTURE Left 06/13/2016   Procedure: OPEN REDUCTION INTERNAL FIXATION (ORIF) LEFT RADIUS AND ULNA FRACTURES;  Surgeon: Kathryne Hitch, MD;  Location: [facility] OR;  Service: Orthopedics;  Laterality: Left;    OB History    No data available       Home Medications    Prior to Admission medications   Medication Sig Start Date End Date Taking? Authorizing Provider  cetirizine (ZYRTEC) 10 MG tablet Take 10 mg by mouth daily as needed for allergies.    Yes [provider]  cholecalciferol (VITAMIN D) 1000 units tablet Take 1,000 Units by mouth daily.   Yes [provider]  ibuprofen (ADVIL,MOTRIN) 200 MG tablet Take 200 mg by mouth every 6 (six) hours as needed for moderate pain.   Yes [provider]  Multiple Vitamins-Minerals (MULTIVITAMIN ADULTS) TABS Take 1 tablet by mouth daily.   Yes [provider]  oxyCODONE-acetaminophen (ROXICET) 5-325 MG tablet Take 1-2 tablets by mouth every 4 (four) hours as needed. Patient not taking: Reported on 12/25/2016 06/14/16   Kathryne Hitch, MD    Family History Family History  Problem Relation Age of Onset  . Hypertension Mother   . Diabetes Mother     Social History Social History  Substance Use Topics  . Smoking status: Never Smoker  . Smokeless tobacco: Never Used  . Alcohol use No     Comment: occassional     Allergies   Patient has no known allergies.   Review of Systems Review of Systems  Constitutional: Negative for chills, fatigue and fever.  Musculoskeletal: Positive for arthralgias and joint swelling. Negative for myalgias.  Skin: Positive for wound. Negative for color change and rash.  Neurological: Negative for headaches.  All other systems reviewed and are negative.    Physical Exam Updated Vital Signs BP (!) 122/91 (BP Location: Right Arm)   Pulse 80   Temp 98.2 F (36.8 C) (Oral)   Resp 20   Ht 5'  3.5" (1.613 m)   Wt 63.5 kg (140 lb)   LMP 05/28/2017   SpO2 99%   BMI 24.41 kg/m   Physical Exam  Constitutional: She appears well-developed and well-nourished. No distress.  HENT:  Head: Normocephalic and atraumatic.  Eyes: Conjunctivae are normal. No scleral icterus.  Neck: Normal range of motion.  Cardiovascular: Normal rate and regular rhythm.   Pulmonary/Chest: Effort normal. No stridor. No respiratory distress.  Abdominal: Soft. She exhibits no distension. There is no tenderness.  Musculoskeletal: She exhibits no edema or deformity.  Left forearm is tender to palpation proximally with obvious swelling. Compartments are soft,  nontender distally. Left hand is warm, well-perfused with intact sensation and motor function. No pain in the wrist or distal forearm. Shoulder is nonpainful and nontender with good range of motion.  There are two 0.5 CM wounds to the posterior aspect of her arm that are oozing blood.    Neurological: She is alert. She exhibits normal muscle tone.  Skin: Skin is warm and dry. She is not diaphoretic.  Psychiatric: She has a normal mood and affect. Her behavior is normal.  Nursing note and vitals reviewed.    ED Treatments / Results  Labs (all labs ordered are listed, but only abnormal results are displayed) Labs Reviewed  BASIC METABOLIC PANEL  CBC WITH DIFFERENTIAL/PLATELET  I-STAT BETA HCG BLOOD, ED ([facility], [facility], [facility] ONLY)    EKG  EKG Interpretation None       Radiology Dg Elbow Complete Left  Result Date: 06/08/2017 CLINICAL DATA:  Ulnar-sided pain after fall. EXAM: LEFT ELBOW - COMPLETE 3+ VIEW COMPARISON:  None. FINDINGS: Acute, closed, comminuted and impacted radial neck fracture without joint dislocation. Acute, closed, oblique fracture of the proximal ulna at the junction of the proximal and middle third, undermining the proximal margin of a pre-existing fixation plate for the ulna. This fracture exposes the tip of the first proximal fixation screw. A fixation screw is also noted spanning a chronic fracture of the midshaft of the radius with small ununited ossific bony fragment seen posteriorly. IMPRESSION: 1. Acute, closed, comminuted and impacted radial neck fracture without joint dislocation. 2. Acute, closed, oblique fracture of the proximal ulna at the junction of the proximal and middle third, undermining the proximal margin of a pre-existing fixation plate for the ulna. This fracture exposes the tip of the first proximal fixation screw. 3. Chronic midshaft fracture of the radius with intact fixation plate and screws and small ununited ossific fragment noted posteriorly.  Electronically Signed   By: Tollie Ethavid  Kwon M.D.   On: 06/08/2017 20:41   Dg Forearm Left  Result Date: 06/08/2017 CLINICAL DATA:  Left forearm pain along the ulnar aspect radiating to elbow after fall today. EXAM: LEFT FOREARM - 2 VIEW COMPARISON:  06/13/2016 FINDINGS: Soft tissue swelling of the forearm overlying a new fracture at the junction of the proximal and middle third of the ulna with one shaft width volar and medial displacement of the distal fracture fragment. This fracture undermines the proximal margin of the fixation plate and extends obliquely to the tip of the first proximal fixation screw which is now exposed by the new fracture. New impacted and slightly comminuted radial neck fracture is noted. Midshaft healing fractures of the radius and ulna are noted transfixed by plate and screws. No fracture of the fixation hardware. IMPRESSION: 1. New acute fracture at the junction of the proximal middle third of the ulna undermining the proximal margin of a pre-existing fixation plate  for the ulna and extending obliquely to expose the tip of the first fixation screw. One shaft width volar and medial displacement is noted of the distal fracture fragment. 2. New impacted and slightly comminuted radial neck fracture. 3. Fixation plate and screws about chronic midshaft fractures of the radius and ulna appear intact without evidence of hardware failure or hardware fracture. Electronically Signed   By: Tollie Eth M.D.   On: 06/08/2017 20:34   Dg Wrist Complete Left  Result Date: 06/08/2017 CLINICAL DATA:  Ulnar-sided forearm pain after fall today. EXAM: LEFT WRIST - COMPLETE 3+ VIEW COMPARISON:  06/13/2016 FINDINGS: There is no evidence of fracture or dislocation. Slight ulnar minus variance. There is no evidence of arthropathy or other focal bone abnormality. Fixation plates and screws are partially included of the mid radius and ulna. Soft tissues are unremarkable. IMPRESSION: No fracture or dislocation  of the left wrist.  Ulnar minus variance. Electronically Signed   By: Tollie Eth M.D.   On: 06/08/2017 20:43    Procedures Procedures (including critical care time)  Medications Ordered in ED Medications  ceFAZolin (ANCEF) IVPB 1 g/50 mL premix (1 g Intravenous New Bag/Given 06/08/17 2216)  Tdap (BOOSTRIX) injection 0.5 mL (not administered)  morphine 4 MG/ML injection 4 mg (4 mg Intravenous Given 06/08/17 2216)     Initial Impression / Assessment and Plan / ED Course  I have reviewed the triage vital signs and the nursing notes.  Pertinent labs & imaging results that were available during my care of the patient were reviewed by me and considered in my medical decision making (see chart for details).  Clinical Course as of Jun 08 2340  Fri Jun 08, 2017  2218 Spoke with ortho who asked for basic pre-op labs and says he will come see patient.   [EH]  2322 Spoke with Dr. Otelia Sergeant who will take her to the OR tonight.    [EH]    Clinical Course User Index [EH] Cristina Gong, PA-C   [name] Dec presents with left proximal forearm pain after falling this afternoon. She previously has fallen injuring this arm which required ORIF and plates.  X-rays were obtained which showed fractures to both radius and ulna.  Area of wound is consistent with fracture site and, based on patient's history of straightening her arm, appears to represent open fracture. Anfef was ordered.  Tetanus updated.  Pain managed with morphine in the ED.  Patient was NPO.  Ortho was consulted and requested basic pre-op labs and CXR, says he will come see patient.  Ortho Dr. Otelia Sergeant saw patient, said he will take patient to OR tonight.   The patient appears reasonably stabilized for admission considering the current resources, flow, and capabilities available in the ED at this time, and I doubt any other [facility] requiring further screening and/or treatment in the ED prior to admission.    Final Clinical Impressions(s) / ED  Diagnoses   Final diagnoses:  Open fracture  Type I or II open displaced fracture of neck of left radius, initial encounter  Type I or II open fracture of proximal end of left ulna, unspecified fracture morphology, initial encounter  Fall, initial encounter    New Prescriptions New Prescriptions   No medications on file     Norman Clay 06/08/17 2343    Derwood Kaplan, MD 06/09/17 [phone]

## 2017-06-08 NOTE — Progress Notes (Signed)
Pharmacy Antibiotic Note  Karla Nichols is a 31 y.o. female admitted on 06/08/2017 with arm pain after a mechanical fall.   X-rays were obtained which showed fractures to both radius and ulna.  Pharmacy has been consulted for gentamicin dosing.  Plan: Gentamicin 290mg  (5mg /kg/ABW) IV q24h 10 hour random gent level   Height: 5' 3.5" (161.3 cm) Weight: 140 lb (63.5 kg) IBW/kg (Calculated) : 53.55  Temp (24hrs), Avg:98.5 F (36.9 C), Min:98.2 F (36.8 C), Max:98.8 F (37.1 C)   Recent Labs Lab 06/08/17 2217  WBC 14.2*  CREATININE 0.75    Estimated Creatinine Clearance: 86.2 mL/min (by C-G formula based on SCr of 0.75 mg/dL).    No Known Allergies  Antimicrobials this admission: 7/20 ancef x1 7/21 gent >> Dose adjustments this admission:   Microbiology results: None  Thank you for allowing pharmacy to be a part of this patient's care.  Arley Phenixllen Chi Woodham RPh 06/08/2017, 11:42 PM Pager [phone]

## 2017-06-09 ENCOUNTER — Encounter (HOSPITAL_COMMUNITY)
Admission: EM | Disposition: A | Discharge status: Home / Self Care | Payer: Self-pay | Source: Home / Self Care | Attending: Specialist

## 2017-06-09 ENCOUNTER — Encounter (HOSPITAL_COMMUNITY): Payer: Self-pay | Admitting: Anesthesiology

## 2017-06-09 ENCOUNTER — Inpatient Hospital Stay (HOSPITAL_COMMUNITY): Payer: BLUE CROSS/BLUE SHIELD

## 2017-06-09 ENCOUNTER — Inpatient Hospital Stay (HOSPITAL_COMMUNITY): Payer: BLUE CROSS/BLUE SHIELD | Admitting: Anesthesiology

## 2017-06-09 DIAGNOSIS — S52122A Displaced fracture of head of left radius, initial encounter for closed fracture: Secondary | ICD-10-CM

## 2017-06-09 DIAGNOSIS — S52272B Monteggia's fracture of left ulna, initial encounter for open fracture type I or II: Secondary | ICD-10-CM

## 2017-06-09 HISTORY — PX: ORIF ULNAR FRACTURE: SHX5417

## 2017-06-09 LAB — BASIC METABOLIC PANEL
ANION GAP: 7 (ref 5–15)
BUN: 9 mg/dL (ref 6–20)
CHLORIDE: 106 mmol/L (ref 101–111)
CO2: 25 mmol/L (ref 22–32)
CREATININE: 0.73 mg/dL (ref 0.44–1.00)
Calcium: 8.9 mg/dL (ref 8.9–10.3)
GFR calc non Af Amer: >60 mL/min (ref >60)
Glucose, Bld: 129 mg/dL — ABNORMAL HIGH (ref 65–99)
POTASSIUM: 4.3 mmol/L (ref 3.5–5.1)
SODIUM: 138 mmol/L (ref 135–145)

## 2017-06-09 LAB — GENTAMICIN LEVEL, RANDOM: GENTAMICIN RM: 1 ug/mL

## 2017-06-09 SURGERY — OPEN REDUCTION INTERNAL FIXATION (ORIF) ULNAR FRACTURE
Anesthesia: Regional | Laterality: Left

## 2017-06-09 MED ORDER — GENTAMICIN SULFATE 40 MG/ML IJ SOLN
7.0000 mg/kg | INTRAVENOUS | Status: DC
  Administered 2017-06-09 – 2017-06-10 (×2): 400 mg via INTRAVENOUS
  Filled 2017-06-09 (×4): qty 10

## 2017-06-09 MED ORDER — PROPOFOL 10 MG/ML IV BOLUS
INTRAVENOUS | Status: AC
  Filled 2017-06-09: qty 20

## 2017-06-09 MED ORDER — LIDOCAINE 2% (20 MG/ML) 5 ML SYRINGE
INTRAMUSCULAR | Status: DC | PRN
  Administered 2017-06-09: 50 mg via INTRAVENOUS

## 2017-06-09 MED ORDER — HYDROCODONE-ACETAMINOPHEN 5-325 MG PO TABS
1.0000 | ORAL_TABLET | ORAL | Status: DC | PRN
  Administered 2017-06-09 – 2017-06-10 (×5): 1 via ORAL
  Filled 2017-06-09 (×5): qty 1

## 2017-06-09 MED ORDER — SODIUM CHLORIDE 0.9 % IV SOLN
INTRAVENOUS | Status: AC
  Filled 2017-06-09: qty 500000

## 2017-06-09 MED ORDER — FENTANYL CITRATE (PF) 100 MCG/2ML IJ SOLN
INTRAMUSCULAR | Status: DC | PRN
  Administered 2017-06-09: 100 ug via INTRAVENOUS

## 2017-06-09 MED ORDER — FENTANYL CITRATE (PF) 250 MCG/5ML IJ SOLN
INTRAMUSCULAR | Status: AC
  Filled 2017-06-09: qty 5

## 2017-06-09 MED ORDER — ROPIVACAINE HCL 5 MG/ML IJ SOLN
INTRAMUSCULAR | Status: DC | PRN
  Administered 2017-06-09: 20 mL via PERINEURAL

## 2017-06-09 MED ORDER — IBUPROFEN 200 MG PO TABS
200.0000 mg | ORAL_TABLET | Freq: Four times a day (QID) | ORAL | Status: DC | PRN
  Administered 2017-06-09 – 2017-06-10 (×3): 200 mg via ORAL
  Filled 2017-06-09 (×3): qty 1

## 2017-06-09 MED ORDER — LORATADINE 10 MG PO TABS
10.0000 mg | ORAL_TABLET | Freq: Every day | ORAL | Status: DC
  Administered 2017-06-09 – 2017-06-11 (×3): 10 mg via ORAL
  Filled 2017-06-09 (×3): qty 1

## 2017-06-09 MED ORDER — VITAMIN D3 25 MCG (1000 UNIT) PO TABS
1000.0000 [IU] | ORAL_TABLET | Freq: Every day | ORAL | Status: DC
  Administered 2017-06-09 – 2017-06-11 (×3): 1000 [IU] via ORAL
  Filled 2017-06-09 (×3): qty 1

## 2017-06-09 MED ORDER — CEFAZOLIN SODIUM-DEXTROSE 2-4 GM/100ML-% IV SOLN: 2.0000 g | INTRAVENOUS | Status: DC

## 2017-06-09 MED ORDER — MORPHINE SULFATE (PF) 4 MG/ML IV SOLN: 1.0000 mg | INTRAVENOUS | Status: DC | PRN

## 2017-06-09 MED ORDER — MIDAZOLAM HCL 5 MG/5ML IJ SOLN
INTRAMUSCULAR | Status: DC | PRN
  Administered 2017-06-09: 2 mg via INTRAVENOUS

## 2017-06-09 MED ORDER — DEXAMETHASONE SODIUM PHOSPHATE 10 MG/ML IJ SOLN
INTRAMUSCULAR | Status: DC | PRN
  Administered 2017-06-09: 10 mg via INTRAVENOUS

## 2017-06-09 MED ORDER — LACTATED RINGERS IV SOLN
INTRAVENOUS | Status: DC | PRN
  Administered 2017-06-09 (×2): via INTRAVENOUS

## 2017-06-09 MED ORDER — POLYMYXIN B SULFATE 500000 UNITS IJ SOLR
INTRAMUSCULAR | Status: DC | PRN
  Administered 2017-06-09: 500 mL

## 2017-06-09 MED ORDER — POVIDONE-IODINE 10 % EX SWAB: 2.0000 "application " | Freq: Once | CUTANEOUS | Status: DC

## 2017-06-09 MED ORDER — ADULT MULTIVITAMIN W/MINERALS CH
1.0000 | ORAL_TABLET | Freq: Every day | ORAL | Status: DC
Start: 2017-06-09 — End: 2017-06-11
  Administered 2017-06-09 – 2017-06-11 (×3): 1 via ORAL
  Filled 2017-06-09 (×3): qty 1

## 2017-06-09 MED ORDER — SODIUM CHLORIDE 0.9 % IR SOLN
Status: DC | PRN
  Administered 2017-06-09: 1000 mL

## 2017-06-09 MED ORDER — OXYCODONE-ACETAMINOPHEN 5-325 MG PO TABS: 1.0000 | ORAL_TABLET | Freq: Four times a day (QID) | ORAL | Status: DC | PRN

## 2017-06-09 MED ORDER — MIDAZOLAM HCL 2 MG/2ML IJ SOLN
INTRAMUSCULAR | Status: AC
  Filled 2017-06-09: qty 2

## 2017-06-09 MED ORDER — PROPOFOL 10 MG/ML IV BOLUS
INTRAVENOUS | Status: DC | PRN
  Administered 2017-06-09: 140 mg via INTRAVENOUS

## 2017-06-09 SURGICAL SUPPLY — 39 items
BAG ZIPLOCK 12X15 (MISCELLANEOUS) ×3 IMPLANT
BANDAGE ACE 4X5 VEL STRL LF (GAUZE/BANDAGES/DRESSINGS) ×3 IMPLANT
BNDG GAUZE ELAST 4 BULKY (GAUZE/BANDAGES/DRESSINGS) ×3 IMPLANT
BNDG PLASTER X FAST 4X5 WHT LF (CAST SUPPLIES) ×3 IMPLANT
COVER SURGICAL LIGHT HANDLE (MISCELLANEOUS) ×3 IMPLANT
CUFF TOURN SGL QUICK 18 (TOURNIQUET CUFF) ×3 IMPLANT
DRAPE OEC MINIVIEW 54X84 (DRAPES) ×3 IMPLANT
DRAPE U-SHAPE 47X51 STRL (DRAPES) ×3 IMPLANT
DRSG PAD ABDOMINAL 8X10 ST (GAUZE/BANDAGES/DRESSINGS) ×3 IMPLANT
DURAPREP 26ML APPLICATOR (WOUND CARE) ×3 IMPLANT
ELECT REM PT RETURN 15FT ADLT (MISCELLANEOUS) ×3 IMPLANT
GAUZE SPONGE 4X4 12PLY STRL (GAUZE/BANDAGES/DRESSINGS) ×3 IMPLANT
GAUZE SPONGE 4X4 16PLY XRAY LF (GAUZE/BANDAGES/DRESSINGS) ×3 IMPLANT
GAUZE XEROFORM 1X8 LF (GAUZE/BANDAGES/DRESSINGS) ×3 IMPLANT
GLOVE ECLIPSE 8.5 STRL (GLOVE) ×6 IMPLANT
GOWN STRL REUS W/TWL XL LVL3 (GOWN DISPOSABLE) ×6 IMPLANT
KIT BASIN OR (CUSTOM PROCEDURE TRAY) ×3 IMPLANT
MANIFOLD NEPTUNE II (INSTRUMENTS) ×3 IMPLANT
NS IRRIG 1000ML POUR BTL (IV SOLUTION) ×3 IMPLANT
PACK ORTHO EXTREMITY (CUSTOM PROCEDURE TRAY) ×3 IMPLANT
PAD CAST 4YDX4 CTTN HI CHSV (CAST SUPPLIES) ×1 IMPLANT
PADDING CAST COTTON 4X4 STRL (CAST SUPPLIES) ×2
PLATE LCP RECON 3.5 6H/84 (Plate) ×3 IMPLANT
POSITIONER SURGICAL ARM (MISCELLANEOUS) ×3 IMPLANT
SCREW CORTEX 3.5 14MM (Screw) ×2 IMPLANT
SCREW CORTEX 3.5 18MM (Screw) ×4 IMPLANT
SCREW CORTEX 3.5 20MM (Screw) ×2 IMPLANT
SCREW CORTEX 3.5 24MM (Screw) ×2 IMPLANT
SCREW LOCK CORT ST 3.5X14 (Screw) ×1 IMPLANT
SCREW LOCK CORT ST 3.5X18 (Screw) ×2 IMPLANT
SCREW LOCK CORT ST 3.5X20 (Screw) ×1 IMPLANT
SCREW LOCK CORT ST 3.5X24 (Screw) ×1 IMPLANT
SCREW LOCK T15 FT 10X3.5X2.9X (Screw) ×2 IMPLANT
SCREW LOCK T15 FT 38X3.5XST (Screw) ×1 IMPLANT
SCREW LOCKING 3.5X10 (Screw) ×4 IMPLANT
SCREW LOCKING 3.5X38 (Screw) ×2 IMPLANT
SUT ETHILON 4 0 PS 2 18 (SUTURE) ×3 IMPLANT
TOWEL OR 17X26 10 PK STRL BLUE (TOWEL DISPOSABLE) ×6 IMPLANT
WATER STERILE IRR 1500ML POUR (IV SOLUTION) IMPLANT

## 2017-06-09 NOTE — Op Note (Signed)
06/08/2017 - 06/09/2017  4:11 AM  PATIENT:  Karla Nichols  31 y.o. female  MRN: [id number]  OPERATIVE REPORT  PRE-OPERATIVE DIAGNOSIS:  Left proximal ulna fracture  POST-OPERATIVE DIAGNOSIS:  Left proximal ulna fracture  PROCEDURE:  Procedure(s): OPEN REDUCTION INTERNAL FIXATION (ORIF) ULNAR FRACTURE INCISION AND DRAINAGE EXAM UNDER ANESTHESIA RADIAL HEAD    SURGEON:  Jessy Oto, MD     ANESTHESIA:  General, supplemented with left axillary block, Dr. Jillyn Hidden.    COMPLICATIONS:  None.   EBL: 50cc  DRAINS: Penrose drain left volar proximal 1/3 forearm  SPECIMEN: Swab of the superficial and subcutaneus open fracture site sent for culture and sensitivity, and gram stain. A small amount of soft tissue, necrotic Fat from the wound sit also sent.    COMPONENTS:  Implant Name Type Inv. Item Serial No. Manufacturer Lot No. LRB No. Used  SCREW CORTEX 3.5 24MM - [id number] Screw SCREW CORTEX 3.5 24MM  [employer]  Left 1  SCREW CORTEX 3.5 14MM - [id number] Screw SCREW CORTEX 3.5 14MM  [employer]  Left 1  SCREW CORTEX 3.5 18MM - [id number] Screw SCREW CORTEX 3.5 18MM  [employer]  Left 2  PLATE RECONSTRUCTION - [id number] Plate PLATE RECONSTRUCTION  [employer]  Left 1  SCREW LOCKING 3.5X10 - [id number] Screw SCREW LOCKING 3.5X10  [employer]  Left 2  SCREW LOCKING 3.5X38 - [id number] Screw SCREW LOCKING 3.5X38  [employer]  Left 1  SCREW CORTEX 3.5 20MM - [id number] Screw SCREW CORTEX 3.5 20MM   [employer]   Left 1     PROCEDURE:  The patient was met in the holding area, and the appropriate left arm identified and marked with an "X" and my initials.The patient was then transported to OR and was placed on the operative table in a supine position. The patient was then placed under general anesthesia without difficulty. The patient received appropriate preoperative antibiotic prophylaxis Ancef and gentamycin. She also had received a preoperative left axillary block.    Tourniquet was applied to the operative left upper arm. The left arm then was then prepped using sterile conditions from fingertips to the upper arm tourniquet and draped using sterile technique. Time-out procedure was called and correct  The left arm was elevated and Esmarch exsanguinated with a left upper extremity tourniquet elevated to 250 mmHg. incision was then made after timeout over the volar aspect of the left proximal forearm at the area of the puntate wound representing an in to out grade 1 open fracture wound, this wound was easily draining thin bloody discharge. An  approximately 4 cm in length based at the open wound fracture site approximately was made ant the subcutaneous tissues debrided of any necrotic debris was resected. Specimens were obtained for soft tissue culture at the level of the subcutaneous tissue. Following irrigation with copious amounts of double antibiotic irrigant solution.  The incision over the proximal 1/3 ulna was made through skin and subcutaneous tissue in line with the old previous incision scar. The fascia incised at the interval between the extensor and flexor compartments. The previous DCP by stryker was identified as well as the fracture site just at the site of the most proximal hole of the DCP. The periostieum undermined proximally using a periosteal elevator in the subperiosteal plane also distally on the flexor portion of the proximal ulna and posterior aspect. Fracture site identified and carefully opened with supination of the left forearm and debridement of interposed muscle with dental pick  and curett.  Fracture was then carefully aligned and held in place with reducing tenaculum. Longitudinal traction was necessary to reduce the fracture. Fracture held in place with loster claw bone holding forceps. The most proximal screw of the retained plate was removed with a universal screw extraction easy out.  Then a 3.5 interfragmentary cortical lag screw was placed  from medial distal to proximal lateral, drilling the superficial cortex over the distal forearm fragment obliquely with the 3.5 drill bit then placing top hat sleeve and drilling the deep cortex with a 2.5 drill and placing the appropriate length screw after measuring for depth with this there was adequate initial fixation. A 6-hole 3.5 synthes DCP plate was carefully aligned along the medial aspect of the ulna, 3 holes on either side of the fracture line. Drill holes were then placed proximal to the fracture line springing of the fibula laterally. These screw holes were made first drilling with a 2.5 drill measuring depth and placing the appropriate cortical screw after tapping with 3.5 tap. Then from the most proximal to distal screw the fracture was compressed by placing drill holes in eccentric within the plate opening and extending distal and then placing the appropriate cortical screws after measuring for depth biting some DCP action with the first 2 screws just distal to the fracture line. A last screw distally was a 41m unicortical locking screw placed in most distal screw hole position. The two most proximal plate screw holes were filled with locking screws.  Mini C-arm was draped sterilely and brought into the field and the fixation felt to be near anatomic, the interfragmentary screw appeared long and was changed from a 24 mm screw to a 20 mm cortical screw. Mini C-arm then used to observe the radiocapitellum joint, an the impacted radial head and neck fracture. There was no block to supination or pronation, 90 degrees was easily obtained in both supination and pronation the impacted fracture remained stable and the joint line showed no depression or angulation. The alignment and position was acceptable. The incision site was then irrigated with copious amounts of irrigant solution. A 1/4 inch penrose drain placed through the volar incision for I&D of the in/out puncture wound using a hemostat to pass  from the fracture site out the incision for the I&D. The margins of the I&D incision aproximated with a single staple on either side of the drain. The ORIF incision then closed by approximating the fascia layers with interrupted 2-0 Vicryl sutures more superficial layers with interrupted 2-0 Vicryl sutures and the skin closed with stainless steel staples. Xeroform gauze, 4 x 4s ABDs pad then fixed to the incision sites with sterile Webril. And a well-padded long arm posterior splint applied with posterior strap. Tourniquet was released total tourniquet time was 76 minutes at 250 mm Hg. All instrument and sponge counts were correct patient was then reactivated extubated returned to her bed and to the recovery room in satisfactory condition.   FINDINGS: Short oblique fracture of the ulna at the most proximal of a retained ORIF 3.5 6 hole stryker plate from a one year old ORIF of a both bones forearm fracture. Open grade 1 fracture with inside out puncture wound from the distal end of the proximal ulna fragment. The deeper tissues had significant muscle puncture of over 1.5 cm. These were debrided and cultured. A 1/4 inch drain placed at the open wound site. The drain will be advanced on POD#1 and removed POD #2. Antibiotic coverage is broad  spectrum and will be narrowed according to microbiology results if no growth then antibiotics will be   Jessy Oto  06/09/2017, 4:11 AM

## 2017-06-09 NOTE — Progress Notes (Signed)
     Subjective: Day of Surgery Procedure(s) (LRB): OPEN REDUCTION INTERNAL FIXATION (ORIF) ULNAR FRACTURE INCISION AND DRAINAGE EXAM UNDER ANESTHESIA RADIAL HEAD (Left) Awake, alert and oriented x 4. Left arm is elevated on pillows, no saturation of the dressing Left arm.  Patient reports pain as marked.    Objective:   VITALS:  Temp:  [97.6 F (36.4 C)-99.2 F (37.3 C)] 98.5 F (36.9 C) (07/21 0722) Pulse Rate:  [80-106] 99 (07/21 0722) Resp:  [12-20] 16 (07/21 0722) BP: (103-136)/(55-91) 110/72 (07/21 0722) SpO2:  [98 %-100 %] 100 % (07/21 0722) Weight:  [140 lb (63.5 kg)] 140 lb (63.5 kg) (07/20 1953)  Neurologically intact ABD soft Neurovascular intact Intact pulses distally Dorsiflexion/Plantar flexion intact Incision: dressing C/D/I Compartment soft   LABS  Recent Labs  06/08/17 2217  HGB 12.4  WBC 14.2*  PLT 301    Recent Labs  06/08/17 2217  NA 139  K 3.8  CL 103  CO2 24  BUN 11  CREATININE 0.75  GLUCOSE 103*   No results for input(s): LABPT, INR in the last 72 hours.   Assessment/Plan: Day of Surgery Procedure(s) (LRB): OPEN REDUCTION INTERNAL FIXATION (ORIF) ULNAR FRACTURE INCISION AND DRAINAGE EXAM UNDER ANESTHESIA RADIAL HEAD (Left)  Advance diet Up with therapy Continue ABX therapy due to Culture taken of surgical site during joint revision  Kerrin ChampagneJames E Evalynne Locurto 06/09/2017, 1:06 PM Patient ID: Karla DecSara E Nichols, female   DOB: 12/02/1985, 31 y.o.   MRN: [id number]

## 2017-06-09 NOTE — Anesthesia Procedure Notes (Addendum)
Anesthesia Regional Block: Supraclavicular block   Pre-Anesthetic Checklist: ,, timeout performed, Correct Patient, Correct Site, Correct Laterality, Correct Procedure, Correct Position, site marked, Risks and benefits discussed,  Surgical consent,  Pre-op evaluation,  At surgeon's request and post-op pain management  Laterality: Left and Upper  Prep: chloraprep       Needles:  Injection technique: Single-shot  Needle Type: Echogenic Stimulator Needle     Needle Length: 5cm  Needle Gauge: 21   Needle insertion depth: 3 cm   Additional Needles:   Procedures: ultrasound guided,,,,,,,,  Narrative:  Start time: 06/09/2017 1:15 AM End time: 06/09/2017 1:29 AM Injection made incrementally with aspirations every 5 mL.  Performed by: Personally  Anesthesiologist: Leilani AbleHATCHETT, Monteen Toops

## 2017-06-09 NOTE — Anesthesia Preprocedure Evaluation (Addendum)
Anesthesia Evaluation  Patient identified by MRN, date of birth, ID band Patient awake    Reviewed: Allergy & Precautions, H&P , NPO status , Patient's Chart, lab work & pertinent test results  History of Anesthesia Complications Negative for: history of anesthetic complications  Airway Mallampati: I  TM Distance: >3 FB Neck ROM: full    Dental no notable dental hx. (+) Teeth Intact   Pulmonary neg pulmonary ROS,    Pulmonary exam normal breath sounds clear to auscultation       Cardiovascular negative cardio ROS Normal cardiovascular exam Rhythm:regular Rate:Normal     Neuro/Psych  Headaches, negative psych ROS   GI/Hepatic negative GI ROS, Neg liver ROS,   Endo/Other  negative endocrine ROS  Renal/GU negative Renal ROS     Musculoskeletal   Abdominal Normal abdominal exam  (+)   Peds  Hematology negative hematology ROS (+)   Anesthesia Other Findings   Reproductive/Obstetrics negative OB ROS                             Anesthesia Physical  Anesthesia Plan  ASA: II  Anesthesia Plan: General and Regional   Post-op Pain Management: GA combined w/ Regional for post-op pain   Induction: Intravenous  PONV Risk Score and Plan: 4 or greater and Ondansetron, Dexamethasone, Propofol, Midazolam and Scopolamine patch - Pre-op  Airway Management Planned: LMA  Additional Equipment:   Intra-op Plan:   Post-operative Plan: Extubation in OR  Informed Consent: I have reviewed the patients History and Physical, chart, labs and discussed the procedure including the risks, benefits and alternatives for the proposed anesthesia with the patient or authorized representative who has indicated his/her understanding and acceptance.   Dental Advisory Given  Plan Discussed with: CRNA and Surgeon  Anesthesia Plan Comments:         Anesthesia Quick Evaluation

## 2017-06-09 NOTE — Anesthesia Procedure Notes (Signed)
Procedure Name: LMA Insertion Performed by: Hridhaan Yohn J Pre-anesthesia Checklist: Patient identified, Emergency Drugs available, Suction available, Patient being monitored and Timeout performed Patient Re-evaluated:Patient Re-evaluated prior to induction Oxygen Delivery Method: Circle system utilized Preoxygenation: Pre-oxygenation with 100% oxygen Induction Type: IV induction Ventilation: Mask ventilation without difficulty LMA: LMA inserted LMA Size: 4.0 Number of attempts: 1 Placement Confirmation: positive ETCO2,  CO2 detector and breath sounds checked- equal and bilateral Tube secured with: Tape Dental Injury: Teeth and Oropharynx as per pre-operative assessment        

## 2017-06-09 NOTE — Evaluation (Signed)
Physical Therapy Evaluation Patient Details Name: Karla Nichols MRN: [id number] DOB: 04/17/1986 Today's Date: 06/09/2017   History of Present Illness  (ORIF) ULNAR FRACTURE. PHMx: of both bone fracture LUE and gait disorder(dad has it too)  Clinical Impression  The patient  Ambulated with min guard. Has  Prior history of ataxia. Will practice stairs next visit. Pt admitted with above diagnosis. Pt currently with functional limitations due to the deficits listed below (see PT Problem List).  Pt will benefit from skilled PT to increase their independence and safety with mobility to allow discharge to the venue listed below.       Follow Up Recommendations No PT follow up    Equipment Recommendations  None recommended by PT    Recommendations for Other Services       Precautions / Restrictions Precautions Precautions: Fall Restrictions LUE Weight Bearing: Non weight bearing      Mobility  Bed Mobility Overal bed mobility: Independent                Transfers Overall transfer level: Needs assistance Equipment used: 1 person hand held assist Transfers: Sit to/from Stand Sit to Stand: Min guard         General transfer comment: min A to ambulate into bathroom  Ambulation/Gait Ambulation/Gait assistance: Min guard Ambulation Distance (Feet): 200 Feet Assistive device: None Gait Pattern/deviations: Step-through pattern;Ataxic;Drifts right/left     General Gait Details: the patient held the left arm throughout. noted ataxia but patient was balanced and did not lose balance.   Stairs            Wheelchair Mobility    Modified Rankin (Stroke Patients Only)       Balance Overall balance assessment: Needs assistance Sitting-balance support: No upper extremity supported;Feet supported Sitting balance-Leahy Scale: Normal     Standing balance support: During functional activity;No upper extremity supported Standing balance-Leahy Scale: Poor                                Pertinent Vitals/Pain Pain Assessment: No/denies pain    Home Living Family/patient expects to be discharged to:: Private residence Living Arrangements: Parent Available Help at Discharge: Family;Available 24 hours/day Type of Home: House Home Access: Stairs to enter   [employer] CorporationEntrance Stairs-Number of Steps: 1--threshold Home Layout: Two level;Bed/bath upstairs Home Equipment: None Additional Comments: works as a Conservation officer, naturecashier at [employer]    Prior Function Level of Independence: Independent               Higher education careers adviserHand Dominance   Dominant Hand: Right    Extremity/Trunk Assessment   Upper Extremity Assessment Upper Extremity Assessment: Defer to OT evaluation LUE Deficits / Details: PROM of shoulder and digits WNL; has shoulder shrug but no other active movement due to block has not worn off yet LUE Coordination: decreased fine motor;decreased gross motor    Lower Extremity Assessment Lower Extremity Assessment: RLE deficits/detail;LLE deficits/detail RLE Deficits / Details: noted ataxia LLE Deficits / Details: ataxia    Cervical / Trunk Assessment Cervical / Trunk Assessment: Normal  Communication   Communication: No difficulties  Cognition Arousal/Alertness: Awake/alert Behavior During Therapy: WFL for tasks assessed/performed Overall Cognitive Status: Within Functional Limits for tasks assessed  General Comments      Exercises     Assessment/Plan    PT Assessment Patient needs continued PT services  PT Problem List Decreased activity tolerance;Decreased balance;Decreased mobility;Decreased knowledge of precautions;Decreased safety awareness;Decreased knowledge of use of DME       PT Treatment Interventions DME instruction;Gait training;Stair training;Functional mobility training;Therapeutic activities;Patient/family education    PT Goals (Current goals can be found in the Care  Plan section)  Acute Rehab PT Goals 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 PT Goal Formulation: With patient/family Time For Goal Achievement: 06/12/17 Potential to Achieve Goals: Good    Frequency Min 6X/week   Barriers to discharge        Co-evaluation               AM-PAC PT "6 Clicks" Daily Activity  Outcome Measure Difficulty turning over in bed (including adjusting bedclothes, sheets and blankets)?: None Difficulty moving from lying on back to sitting on the side of the bed? : None Difficulty sitting down on and standing up from a chair with arms (e.g., wheelchair, bedside commode, etc,.)?: Total Help needed moving to and from a bed to chair (including a wheelchair)?: Total Help needed walking in hospital room?: Total Help needed climbing 3-5 steps with a railing? : Total 6 Click Score: 12    End of Session   Activity Tolerance: Patient tolerated treatment well Patient left: in bed;with call bell/phone within reach Nurse Communication: Mobility status PT Visit Diagnosis: Unsteadiness on feet (R26.81);Other symptoms and signs involving the nervous system (R29.898)    Time: 5638-7564 PT Time Calculation (min) (ACUTE ONLY): 18 min   Charges:   PT Evaluation $PT Eval Low Complexity: 1 Procedure     PT G CodesBlanchard Kelch PT [phone]  Rada Hay 06/09/2017, 12:26 PM

## 2017-06-09 NOTE — Transfer of Care (Signed)
Immediate Anesthesia Transfer of Care Note  Patient: Karla Nichols  Procedure(s) Performed: Procedure(s): OPEN REDUCTION INTERNAL FIXATION (ORIF) ULNAR FRACTURE INCISION AND DRAINAGE EXAM UNDER ANESTHESIA RADIAL HEAD (Left)  Patient Location: PACU  Anesthesia Type:General  Level of Consciousness: awake, alert  and oriented  Airway & Oxygen Therapy: Patient Spontanous Breathing and Patient connected to face mask oxygen  Post-op Assessment: Report given to RN and Post -op Vital signs reviewed and stable  Post vital signs: Reviewed and stable  Last Vitals:  Vitals:   06/08/17 1943 06/08/17 2248  BP: (!) 122/91 115/76  Pulse: 80   Resp: 20 18  Temp: 36.8 C 37.1 C    Last Pain:  Vitals:   06/09/17 0007  TempSrc:   PainSc: 5          Complications: No apparent anesthesia complications

## 2017-06-09 NOTE — Progress Notes (Signed)
Pt seen to make sure block had worn off and she was moving arm (shoulder and fingers). She is able to without pain. Did issue her some read tubing for a "squeeze ball". Sling delivered by ortho tech but too small, contacted them to let them know this.  Ignacia PalmaCathy Pankaj Haack, North CarolinaOTR/L [phone] 06/09/2017

## 2017-06-09 NOTE — Progress Notes (Addendum)
Pharmacy Antibiotic Note  Karla Nichols is a 31 y.o. female admitted on 06/08/2017 with arm pain after a mechanical fall.   X-rays were obtained which showed fractures to both radius and ulna.  Cefazolin and gentamicin ordered for open fracture wound, surgical prophylaxis.  Pharmacy has been consulted for gentamicin dosing.  S/p surgery 7/21.  10 hour level following first dose of gentamicin acceptable; however, ortho intends on continuing antibiotics as treatment so will increase gentamicin to treatment dose of 7 mg/kg q24h  Plan: Gentamicin 400 mg (7mg /kg/ABW) IV q24h  Height: 5' 3.5" (161.3 cm) Weight: 140 lb (63.5 kg) IBW/kg (Calculated) : 53.55  Temp (24hrs), Avg:98.5 F (36.9 C), Min:97.6 F (36.4 C), Max:99.2 F (37.3 C)   Recent Labs Lab 06/08/17 2217 06/09/17 1018  WBC 14.2*  --   CREATININE 0.75  --   GENTRANDOM  --  1.0    Estimated Creatinine Clearance: 86.2 mL/min (by C-G formula based on SCr of 0.75 mg/dL).    No Known Allergies  Antimicrobials this admission: 7/20 ancef >> 7/21 gent >>  Dose adjustments this admission:   Microbiology results: 7/21 wound tissue culture: IP (no organisms seen on gram stain)  Thank you for allowing pharmacy to be a part of this patient's care.  Clance BollAmanda Zykia Walla, PharmD, BCPS Pager: [phone] 06/09/2017 12:27 PM

## 2017-06-09 NOTE — Brief Op Note (Signed)
06/08/2017 - 06/09/2017  3:59 AM  PATIENT:  Karla Nichols  31 y.o. female  PRE-OPERATIVE DIAGNOSIS:  Left proximal ulna fracture  POST-OPERATIVE DIAGNOSIS:  Left proximal ulna fracture  PROCEDURE:  Procedure(s): OPEN REDUCTION INTERNAL FIXATION (ORIF) ULNAR FRACTURE INCISION AND DRAINAGE EXAM UNDER ANESTHESIA RADIAL HEAD (Left)  SURGEON:  Surgeon(s) and Role:    Kerrin ChampagneNitka, Felicity Penix E, MD - Primary  ANESTHESIA:   regional and general, left axillary block and general via OGE obturator.  Dr. Arby BarretteHatchett.  EBL:  Total I/O In: 1050 [I.V.:1000; IV Piggyback:50] Out: 10 [Blood:10]  BLOOD ADMINISTERED:none  DRAINS: Penrose drain in the left volar proximal forearm to the proximal ulna fracture site   LOCAL MEDICATIONS USED:  NONE  SPECIMEN:  Source of Specimen:  superficial subcutaneous areas of open fracture wound  DISPOSITION OF SPECIMEN:  microbiology for culture and gram stain  COUNTS:  YES  TOURNIQUET:   Total Tourniquet Time Documented: Upper Arm (Left) 76 minutes Total: Upper Arm (Left) 76 minutes  DICTATION: .Reubin Milanragon Dictation  PLAN OF CARE: Admit to inpatient   PATIENT DISPOSITION:  PACU - hemodynamically stable.   Delay start of Pharmacological VTE agent (>24hrs) due to surgical blood loss or risk of bleeding: yes

## 2017-06-09 NOTE — Evaluation (Addendum)
Occupational Therapy Evaluation Patient Details Name: Karla Nichols MRN: [id number] DOB: 1986-11-13 Today's Date: 06/09/2017    History of Present Illness (ORIF) ULNAR FRACTURE. 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 She will benefit from continued acute OT without need for follow up.    Follow Up Recommendations  No OT follow up    Equipment Recommendations  None recommended by OT       Precautions / Restrictions Precautions Precautions: Fall      Mobility Bed Mobility Overal bed mobility: Independent                Transfers Overall transfer level: Needs assistance Equipment used: 1 person hand held assist Transfers: Sit to/from Stand Sit to Stand: Min guard         General transfer comment: min A to ambulate into bathroom    Balance Overall balance assessment: Needs assistance Sitting-balance support: No upper extremity supported;Feet supported Sitting balance-Leahy Scale: Normal     Standing balance support: Single extremity supported;During functional activity Standing balance-Leahy Scale: Poor                             ADL either performed or assessed with clinical judgement   ADL Overall ADL's : Needs assistance/impaired Eating/Feeding: Set up;Sitting   Grooming: Moderate assistance;Sitting   Upper Body Bathing: Moderate assistance;Sitting   Lower Body Bathing: Minimal assistance;Sit to/from stand   Upper Body Dressing : Maximal assistance;Sitting   Lower Body Dressing: Minimal assistance;Sit to/from stand   Toilet Transfer: Minimal assistance;Ambulation;Regular Toilet   Toileting- Clothing Manipulation and Hygiene: Minimal assistance;Sit to/from stand               Vision Patient Visual Report: No change from baseline              Pertinent Vitals/Pain Pain Assessment: No/denies pain (block has not worn off yet)     Hand Dominance Right   Extremity/Trunk Assessment Upper Extremity Assessment Upper Extremity Assessment: LUE deficits/detail LUE Deficits / Details: PROM of shoulder and digits WNL; has shoulder shrug but no other active movement due to block has not worn off yet LUE Coordination: decreased fine motor;decreased gross motor  Educated pt and mother on proper positioning for edema control           Communication Communication Communication: No difficulties   Cognition Arousal/Alertness: Awake/alert Behavior During Therapy: WFL for tasks assessed/performed Overall Cognitive Status: Within Functional Limits for tasks assessed                                                Home Living Family/patient expects to be discharged to:: Private residence Living Arrangements: Parent Available Help at Discharge: Family;Available 24 hours/day Type of Home: House Home Access: Stairs to enter [employer] of Steps: 1--threshold   Home Layout: Two level;Bed/bath upstairs Alternate Level Stairs-Number of Steps: 14 Alternate Level Stairs-Rails: Right (going up steps) Bathroom Shower/Tub: Tub/shower unit;Curtain   Bathroom Toilet: Standard     Home Equipment: None   Additional Comments: works  as a Conservation officer, naturecashier at [employer]      Prior Functioning/Environment Level of Independence: Independent                 OT Problem List: Decreased strength;Decreased range of motion;Impaired balance (sitting and/or standing)      OT Treatment/Interventions: Self-care/ADL training;Therapeutic exercise;Therapeutic activities;DME and/or AE instruction;Patient/family education;Balance training    OT Goals(Current goals can be found in the care plan section) Acute Rehab OT Goals Patient  Stated Goal: to go home and back to work as soon as I can OT Goal Formulation: With patient Time For Goal Achievement: 06/16/17 Potential to Achieve Goals: Good  OT Frequency: Min 3X/week              AM-PAC PT "6 Clicks" Daily Activity     Outcome Measure Help from another person eating meals?: A Little Help from another person taking care of personal grooming?: A Lot Help from another person toileting, which includes using toliet, bedpan, or urinal?: A Little Help from another person bathing (including washing, rinsing, drying)?: A Lot Help from another person to put on and taking off regular upper body clothing?: A Lot Help from another person to put on and taking off regular lower body clothing?: A Little 6 Click Score: 4   End of Session Equipment Utilized During Treatment: Gait belt Nurse Communication:  (Ok to leave O2 monitor off while pt ate breakfast with pt to call once she finished breakfast; pt needs x-large sling for LUE)  Activity Tolerance: Patient tolerated treatment well Patient left: in chair;with call bell/phone within reach;with chair alarm set  OT Visit Diagnosis: Unsteadiness on feet (R26.81);History of falling (Z91.81)                Time: [id number] OT Time Calculation (min): 33 min Charges:  OT General Charges $OT Visit: 1 Procedure OT Evaluation $OT Eval Moderate Complexity: 1 Procedure OT Treatments $Self Care/Home Management : 8-22 mins Ignacia PalmaCathy Adore Kithcart, OTR/L [phone] 06/09/2017

## 2017-06-10 LAB — CBC WITH DIFFERENTIAL/PLATELET
Basophils Absolute: 0 10*3/uL (ref 0.0–0.1)
Basophils Relative: 0 %
EOS ABS: 0.1 10*3/uL (ref 0.0–0.7)
EOS PCT: 1 %
HCT: 29.2 % — ABNORMAL LOW (ref 36.0–46.0)
HEMOGLOBIN: 9.4 g/dL — AB (ref 12.0–15.0)
LYMPHS ABS: 3.8 10*3/uL (ref 0.7–4.0)
Lymphocytes Relative: 32 %
MCH: 27.3 pg (ref 26.0–34.0)
MCHC: 32.2 g/dL (ref 30.0–36.0)
MCV: 84.9 fL (ref 78.0–100.0)
MONO ABS: 1.2 10*3/uL — AB (ref 0.1–1.0)
MONOS PCT: 10 %
Neutro Abs: 6.7 10*3/uL (ref 1.7–7.7)
Neutrophils Relative %: 56 %
Platelets: 224 10*3/uL (ref 150–400)
RBC: 3.44 MIL/uL — ABNORMAL LOW (ref 3.87–5.11)
RDW: 13.7 % (ref 11.5–15.5)
WBC: 11.8 10*3/uL — ABNORMAL HIGH (ref 4.0–10.5)

## 2017-06-10 MED ORDER — HYDROCODONE-ACETAMINOPHEN 5-325 MG PO TABS
1.0000 | ORAL_TABLET | ORAL | Status: DC | PRN
  Administered 2017-06-11 (×2): 1 via ORAL
  Filled 2017-06-10 (×2): qty 1

## 2017-06-10 NOTE — Progress Notes (Addendum)
Occupational Therapy Treatment Patient Details Name: Karla Nichols MRN: [id number] DOB: 1986-07-23 Today's Date: 06/10/2017    History of present illness (ORIF) ULNAR FRACTURE. PHMx: of both bone fracture LUE and gait disorder(dad has it too)   OT comments  Educated pt on UE exercises to help with moving L UE at shoulder and in digits. Pt states she has some discomfort through her forearm when she flexes/extends her digits. Educated on proper positioning for L UE in sitting and supine. Also pt practiced with donning/doffing sling for L UE. Pt stating she has good sensation in L UE today compared to yesterday when block hadn't worn off.     Follow Up Recommendations  No OT follow up    Equipment Recommendations  None recommended by OT    Recommendations for Other Services      Precautions / Restrictions Precautions Precautions: Fall Restrictions Weight Bearing Restrictions: No LUE Weight Bearing: Non weight bearing       Mobility Bed Mobility Overal bed mobility: Independent                Transfers Overall transfer level: Needs assistance Equipment used: None Transfers: Sit to/from Stand Sit to Stand: Supervision              Balance                                           ADL either performed or assessed with clinical judgement   ADL       Grooming: Min guard;Standing                   Toilet Transfer: Min guard;Comfort height toilet;Ambulation   Toileting- Clothing Manipulation and Hygiene: Min guard;Sit to/from stand         General ADL Comments: Pt verbalized correct positioning of L UE on 2-3 pillows and use of ice as needed for pain/edema. Pt donned sling with supervision for correct positioning of straps.      Vision Patient Visual Report: No change from baseline     Perception     Praxis      Cognition Arousal/Alertness: Awake/alert Behavior During Therapy: WFL for tasks assessed/performed Overall  Cognitive Status: Within Functional Limits for tasks assessed                                          Exercises Other Exercises Other Exercises: pt performed digit flexion and extension L hand X 10, shoulder horizontal abduction/abduction X 10 L UE, and shoulder flexion/extension X 10   Shoulder Instructions       General Comments      Pertinent Vitals/ Pain       Pain Assessment: 0-10 Pain Score: 2  Pain Location: L forearm Pain Descriptors / Indicators: Aching Pain Intervention(s): Monitored during session;Ice applied;Repositioned  Home Living                                          Prior Functioning/Environment              Frequency  Min 3X/week        Progress Toward Goals  OT Goals(current goals can now  be found in the care plan section)  Progress towards OT goals: Progressing toward goals     Plan      Co-evaluation                 AM-PAC PT "6 Clicks" Daily Activity     Outcome Measure   Help from another person eating meals?: A Little Help from another person taking care of personal grooming?: A Little Help from another person toileting, which includes using toliet, bedpan, or urinal?: A Little Help from another person bathing (including washing, rinsing, drying)?: A Little Help from another person to put on and taking off regular upper body clothing?: A Little Help from another person to put on and taking off regular lower body clothing?: A Little 6 Click Score: 18    End of Session Equipment Utilized During Treatment: Other (comment) (sling)  OT Visit Diagnosis: Unsteadiness on feet (R26.81);History of falling (Z91.81)   Activity Tolerance Patient tolerated treatment well   Patient Left in chair;with call bell/phone within reach   Nurse Communication          Time: [id number] OT Time Calculation (min): 32 min  Charges: OT General Charges $OT Visit: 1 Procedure OT Treatments $Self  Care/Home Management : 8-22 mins $Therapeutic Activity: 8-22 mins     Zannie KehrStephanie S Braylyn Eye 06/10/2017, 11:04 AM

## 2017-06-10 NOTE — Progress Notes (Addendum)
     Subjective: 1 Day Post-Op Procedure(s) (LRB): OPEN REDUCTION INTERNAL FIXATION (ORIF) ULNAR FRACTURE INCISION AND DRAINAGE EXAM UNDER ANESTHESIA RADIAL HEAD (Left)Awake, alert and oriented x 4. Culture report pending, gram stain with rare WBC, NOS.  Day #2-3 of VI antibiotics, Ancef and gentamycin.  Patient reports pain as moderate.    Objective:   VITALS:  Temp:  [98.5 F (36.9 C)-99 F (37.2 C)] 98.6 F (37 C) (07/22 0506) Pulse Rate:  [77-85] 77 (07/22 0506) Resp:  [15-17] 16 (07/22 0506) BP: (102-116)/(53-68) 102/53 (07/22 0506) SpO2:  [99 %-100 %] 99 % (07/22 0506)  Neurologically intact ABD soft Neurovascular intact Sensation intact distally Intact pulses distally Incision: moderate drainage and Penrose drain discontinued, saturation of dressings with sanginous discharge.  No cellulitis present   LABS  Recent Labs  06/08/17 2217 06/10/17 0523  HGB 12.4 9.4*  WBC 14.2* 11.8*  PLT 301 224    Recent Labs  06/08/17 2217 06/09/17 1346  NA 139 138  K 3.8 4.3  CL 103 106  CO2 24 25  BUN 11 9  CREATININE 0.75 0.73  GLUCOSE 103* 129*   No results for input(s): LABPT, INR in the last 72 hours.   Assessment/Plan: 1 Day Post-Op Procedure(s) (LRB): OPEN REDUCTION INTERNAL FIXATION (ORIF) ULNAR FRACTURE INCISION AND DRAINAGE EXAM UNDER ANESTHESIA RADIAL HEAD (Left)  Advance diet Up with therapy Continue ABX therapy due to Culture taken of surgical site during joint revision  If no growth with cultures tomorrow then discontinue antibiotics and discharge home. If culture positive the may need change of antibiotic to culture specific.  May ambulate with a left arm sling and assistance.  Radiographs from yesterday show lateral displacement of the proximal radial shaft on the radial neck of 2-3 mm. The radial neck fracture look nearly out to length post ORIF of the ulna. Will discuss with my partners whether any other intervention should be considered for the  Radial neck fracture.   [name] 06/10/2017, 10:40 AM Patient ID: Karla Nichols, female   DOB: 05/09/1986, 31 y.o.   MRN: [id number]

## 2017-06-10 NOTE — Progress Notes (Signed)
Physical Therapy Treatment Patient Details Name: Karla Nichols MRN: [id number] DOB: 15-Mar-1986 Today's Date: 06/10/2017    History of Present Illness (ORIF) ULNAR FRACTURE. 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 With trial of straight cane, pt reported no improvement in balance. Stair training completed. PT recommending pt have stand by assist when going up/down stairs. Pt stated her mother can provide this.    Follow Up Recommendations  No PT follow up     Equipment Recommendations  None recommended by PT    Recommendations for Other Services       Precautions / Restrictions Precautions Precautions: Fall Restrictions Weight Bearing Restrictions: Yes LUE Weight Bearing: Non weight bearing    Mobility  Bed Mobility Overal bed mobility: Independent                Transfers Overall transfer level: Needs assistance Equipment used: None Transfers: Sit to/from Stand Sit to Stand: Supervision            Ambulation/Gait Ambulation/Gait assistance: Supervision Ambulation Distance (Feet): 350 Feet Assistive device: None;Straight cane Gait Pattern/deviations: Step-through pattern;Ataxic;Drifts right/left     General Gait Details:  noted ataxia but patient was balanced and did not lose balance, brief trial of cane -pt stated it was not helpful so stopped using it   Stairs Stairs: Yes   Stair Management: One rail Right;Forwards;Step to pattern;Sideways Number of Stairs: 10 General stair comments: step over step going up, step to sideways going down, recommended pt have SBA for flight of stairs to her room at home, pt stated her mother can provide this  Wheelchair Mobility    Modified Rankin (Stroke Patients Only)       Balance Overall balance assessment: Needs assistance Sitting-balance support: No upper extremity supported;Feet  supported Sitting balance-Leahy Scale: Normal     Standing balance support: During functional activity;No upper extremity supported Standing balance-Leahy Scale: Poor                              Cognition Arousal/Alertness: Awake/alert Behavior During Therapy: WFL for tasks assessed/performed Overall Cognitive Status: Within Functional Limits for tasks assessed                                        Exercises Other Exercises Other Exercises: pt performed digit flexion and extension L hand X 10, shoulder horizontal abduction/abduction X 10 L UE, and shoulder flexion/extension X 10    General Comments        Pertinent Vitals/Pain Pain Assessment: 0-10 Pain Score: 2  Pain Location: L forearm Pain Descriptors / Indicators: Aching Pain Intervention(s): Monitored during session;Ice applied;Limited activity within patient's tolerance    Home Living                      Prior Function            PT Goals (current goals can now be found in the care plan section) Acute Rehab PT Goals 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 : None Difficulty sitting down on and standing up from a chair with arms (e.g., wheelchair, bedside commode, etc,.)?: A Little Help needed moving to and from a bed to chair (including a wheelchair)?: A Little Help needed walking in hospital room?: A Little Help needed climbing 3-5  steps with a railing? : A Little 6 Click Score: 20    End of Session Equipment Utilized During Treatment: Gait belt Activity Tolerance: Patient tolerated treatment well Patient left: with call bell/phone within reach;in chair;with family/visitor present Nurse Communication: Mobility status PT Visit Diagnosis: Unsteadiness on feet (R26.81);Other symptoms and signs involving the nervous system (R29.898)     Time: [id number] PT Time Calculation (min) (ACUTE ONLY): 18 min  Charges:  $Gait Training: 8-22 mins                    G Codes:          Karla Nichols, Karla Nichols 06/10/2017, 11:23 AM [phone]

## 2017-06-10 NOTE — Anesthesia Postprocedure Evaluation (Signed)
Anesthesia Post Note  Patient: Karla Nichols  Procedure(s) Performed: Procedure(s) (LRB): OPEN REDUCTION INTERNAL FIXATION (ORIF) ULNAR FRACTURE INCISION AND DRAINAGE EXAM UNDER ANESTHESIA RADIAL HEAD (Left)     Patient location during evaluation: Nursing Unit Anesthesia Type: General Level of consciousness: awake Pain management: pain level controlled Vital Signs Assessment: post-procedure vital signs reviewed and stable Respiratory status: spontaneous breathing Cardiovascular status: stable Postop Assessment: no signs of nausea or vomiting Anesthetic complications: no    Last Vitals:  Vitals:   06/10/17 0049 06/10/17 0506  BP: (!) 105/53 (!) 102/53  Pulse: 85 77  Resp: 17 16  Temp: 36.9 C 37 C    Last Pain:  Vitals:   06/10/17 0844  TempSrc:   PainSc: 5    Pain Goal: Patients Stated Pain Goal: 1 (06/10/17 0545)               Rush Salce JR,JOHN Susann GivensFRANKLIN

## 2017-06-11 ENCOUNTER — Encounter (HOSPITAL_COMMUNITY): Payer: Self-pay | Admitting: Specialist

## 2017-06-11 DIAGNOSIS — S52123A Displaced fracture of head of unspecified radius, initial encounter for closed fracture: Secondary | ICD-10-CM | POA: Insufficient documentation

## 2017-06-11 MED ORDER — DOCUSATE SODIUM 100 MG PO CAPS: 100.0000 mg | ORAL_CAPSULE | Freq: Two times a day (BID) | ORAL | 0 refills | Status: DC

## 2017-06-11 MED ORDER — OXYCODONE-ACETAMINOPHEN 5-325 MG PO TABS: 1.0000 | ORAL_TABLET | Freq: Four times a day (QID) | ORAL | 0 refills | Status: DC | PRN

## 2017-06-11 MED ORDER — METHOCARBAMOL 500 MG PO TABS: 500.0000 mg | ORAL_TABLET | Freq: Three times a day (TID) | ORAL | 0 refills | Status: DC | PRN

## 2017-06-11 NOTE — Addendum Note (Signed)
Addendum  created 06/11/17 [id number] by Elyn PeersAllen, Racquel Arkin J, CRNA   Charge Capture section accepted

## 2017-06-11 NOTE — Progress Notes (Signed)
Occupational Therapy Treatment Patient Details Name: Karla Nichols MRN: [id number] DOB: 1985/12/12 Today's Date: 06/11/2017    History of present illness (ORIF) ULNAR FRACTURE. 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)   OT comments   mom will A as needed  Follow Up Recommendations  No OT follow up    Equipment Recommendations  None recommended by OT    Recommendations for Other Services      Precautions / Restrictions Restrictions LUE Weight Bearing: Non weight bearing       Mobility Bed Mobility Overal bed mobility: Independent                Transfers Overall transfer level: Needs assistance Equipment used: None Transfers: Sit to/from Stand Sit to Stand: Supervision                  ADL either performed or assessed with clinical judgement   ADL          mom will A as needed                                               Cognition Arousal/Alertness: Awake/alert Behavior During Therapy: WFL for tasks assessed/performed Overall Cognitive Status: Within Functional Limits for tasks assessed                                          Exercises Other Exercises Other Exercises: pt performed digit flexion and extension L hand X 10, shoulder horizontal abduction/abduction X 10 L UE, and shoulder flexion/extension X 10      General Comments  pt and mom I with ADL exercise    Pertinent Vitals/ Pain       Pain Score: 2  Pain Location: L forearm Pain Descriptors / Indicators: Sore Pain Intervention(s): Limited activity within patient's tolerance;Monitored during session;Ice applied   Frequency  Min 3X/week        Progress Toward Goals  OT Goals(current goals can now be found in the care plan section)  Progress towards OT goals: Progressing toward goals  Acute Rehab OT Goals 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, Conservation officer, 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 appropriate    Co-evaluation                 AM-PAC PT "6 Clicks" Daily Activity     Outcome Measure   Help from another person eating meals?: A Little Help from another person taking care of personal grooming?: A Little Help from another person toileting, which includes using toliet, bedpan, or urinal?: A Little Help from another person bathing (including washing, rinsing, drying)?: A Little Help from another person to put on and taking off regular upper body clothing?: A Little Help from another person to put on and taking off regular lower body clothing?: A Little 6 Click Score: 18    End of Session Equipment Utilized During Treatment: Other (comment) (sling)  OT Visit Diagnosis: Unsteadiness on feet (R26.81);History of falling (Z91.81)   Activity Tolerance Patient tolerated treatment well   Patient Left in chair;with call bell/phone within reach   Nurse Communication  Time: 1052-1101 OT Time Calculation (min): 9 min  Charges: OT General Charges $OT Visit: 1 Procedure OT Treatments $Therapeutic Exercise: 8-22 mins     Emre Stock, Karin GoldenLorraine D 06/11/2017, 11:17 AM

## 2017-06-11 NOTE — Discharge Instructions (Addendum)
Cast or Splint Care, Adult Casts and splints are supports that are worn to protect broken bones and other injuries. A cast or splint may hold a bone still and in the correct position while it heals. Casts and splints may also help to ease pain, swelling, and muscle spasms. How to care for your cast  Do not stick anything inside the cast to scratch your skin.  Check the skin around the cast every day. Tell your doctor about any concerns.  You may put lotion on dry skin around the edges of the cast. Do not put lotion on the skin under the cast.  Keep the cast clean.  If the cast is not waterproof: ? Do not let it get wet. ? Cover it with a watertight covering when you take a bath or a shower. How to care for your splint  Wear it as told by your doctor. Take it off only as told by your doctor.  Loosen the splint if your fingers or toes tingle, get numb, or turn cold and blue.  Keep the splint clean.  If the splint is not waterproof: ? Do not let it get wet. ? Cover it with a watertight covering when you take a bath or a shower. Follow these instructions at home: Bathing  Do not take baths or swim until your doctor says it is okay. Ask your doctor if you can take showers. You may only be allowed to take sponge baths for bathing.  If your cast or splint is not waterproof, cover it with a watertight covering when you take a bath or shower. Managing pain, stiffness, and swelling  Move your fingers or toes often to avoid stiffness and to lessen swelling.  Raise (elevate) the injured area above the level of your heart while sitting or lying down. Safety  Do not use the injured limb to support your body weight until your doctor says that it is okay.  Use crutches or other assistive devices as told by your doctor. General instructions  Do not put pressure on any part of the cast or splint until it is fully hardened. This may take many hours.  Return to your normal activities as  told by your doctor. Ask your doctor what activities are safe for you.  Keep all follow-up visits as told by your doctor. This is important. Contact a doctor if:  Your cast or splint gets damaged.  The skin around the cast gets red or raw.  The skin under the cast is very itchy or painful.  Your cast or splint feels very uncomfortable.  Your cast or splint is too tight or too loose.  Your cast becomes wet or it starts to have a soft spot or area.  You get an object stuck under your cast. Get help right away if:  Your pain gets worse.  The injured area tingles, gets numb, or turns blue and cold.  The part of your body above or below the cast is swollen and it turns a different color (is discolored).  You cannot feel or move your fingers or toes.  There is fluid leaking through the cast.  You have very bad pain or pressure under the cast.  You have trouble breathing.  You have shortness of breath.  You have chest pain. This information is not intended to replace advice given to you by your health care provider. Make sure you discuss any questions you have with your health care provider. Document Released: 03/08/2011  Document Revised: 10/27/2016 Document Reviewed: 10/27/2016 Elsevier Interactive Patient Education  2017 [employer]  Elbow Fracture Treated With ORIF, Care After Refer to this sheet in the next few weeks. These instructions provide you with information about caring for yourself after your procedure. Your health care provider may also give you more specific instructions. Your treatment has been planned according to current medical practices, but problems sometimes occur. Call your health care provider if you have any problems or questions after your procedure. What can I expect after the procedure? After the procedure, it is common to have:  Soreness.  Mild swelling.  Stiffness.  Follow these instructions at home: If you have a splint:  Wear it as  directed by your health care provider. Remove it only as directed by your health care provider.  Loosen the splint if your fingers become numb and tingle, or if they turn cold and blue. Bathing  Do not take baths, swim, or use a hot tub until your health care provider approves. Ask your health care provider if you can take showers. You may only be allowed to take sponge baths for bathing.  If your health care provider approves bathing and showering, cover the splint with a watertight plastic bag to protect it from water. Do not let the splint get wet. If you were given a removable splint to wear, only remove it for bathing as directed by your health care provider.  Keep the bandage (dressing) dry until your health care provider says it can be removed. Managing pain, stiffness, and swelling  Move your fingers often to avoid stiffness and to lessen swelling.  Raise the injured area above the level of your heart while you are sitting or lying down.  If directed, apply ice to the incision: ? Put ice in a plastic bag. ? Place a towel between your skin and the bag. ? Leave the ice on for 20 minutes, 2-3 times per day. Driving  Do not drive or operate heavy machinery while taking pain medicine.  Do not drive while wearing a splint on a hand that you use for driving. Activity  Return to your normal activities as directed by your health care provider. Ask your health care provider what activities are safe for you.  Perform range-of-motion exercises only as directed by your health care provider. Incision care  There are many different ways to close and cover an incision, including stitches (sutures), staples, and adhesive strips. Follow instructions from your health care provider about: ? Incision care. ? Dressing changes and removal. ? Incision closure removal.  Check your incision area every day for signs of infection. Watch for: ? Redness, swelling, or pain. ? Fluid, blood, or  pus. General instructions  Do not put pressure on any part of the splint until it is fully hardened. This may take several hours.  If you were given a sling, wear it as directed by your health care provider.  Keep the splint clean and dry.  Do not use any tobacco products, including cigarettes, chewing tobacco, or electronic cigarettes. Tobacco can delay bone healing. If you need help quitting, ask your health care provider.  Take medicines only as directed by your health care provider.  Keep all follow-up visits as directed by your health care provider. This is important.  Do not use the injured limb to support your body weight until your health care provider says that you can. Contact a health care provider if:  You have redness, swelling, or pain  at the site of your incision.  You have fluid, blood, or pus coming from your incision.  You have a fever or chills.  You notice a bad smell coming from your incision or your dressing.  The edges of your incision break open after the sutures or staples have been removed. Get help right away if:  You develop a rash.  You have difficulty breathing.  You have numbness or tingling in your hand or forearm. This information is not intended to replace advice given to you by your health care provider. Make sure you discuss any questions you have with your health care provider. Document Released: 05/26/2005 Document Revised: 04/03/2016 Document Reviewed: 11/02/2014 Elsevier Interactive Patient Education  2018 [employer].    Keep dressing dry. Elevated elbow above heart. Apply ice to backside of forearm two hours on and one half hour off for 48 hours. May Apply ice at night and go to sleep with out changing. Be sure to keep ice off fingers to prevent frost bite.  Return to office in two weeks for incision check and to start exercises. Dr. Clarisa Kindred notes indicate that the trouble with balance and coordination with walking may be  progressive and is likely genetic.  Fall Prevention and Home Safety Falls cause injuries and can affect all age groups. It is possible to use preventive measures to significantly decrease the likelihood of falls. There are many simple measures which can make your home safer and prevent falls. OUTDOORS  Repair cracks and edges of walkways and driveways.  Remove high doorway thresholds.  Trim shrubbery on the main path into your home.  Have good outside lighting.  Clear walkways of tools, rocks, debris, and clutter.  Check that handrails are not broken and are securely fastened. Both sides of steps should have handrails.  Have leaves, snow, and ice cleared regularly.  Use sand or salt on walkways during winter months.  In the garage, clean up grease or oil spills. BATHROOM  Install night lights.  Install grab bars by the toilet and in the tub and shower.  Use non-skid mats or decals in the tub or shower.  Place a plastic non-slip stool in the shower to sit on, if needed.  Keep floors dry and clean up all water on the floor immediately.  Remove soap buildup in the tub or shower on a regular basis.  Secure bath mats with non-slip, double-sided rug tape.  Remove throw rugs and tripping hazards from the floors. BEDROOMS  Install night lights.  Make sure a bedside light is easy to reach.  Do not use oversized bedding.  Keep a telephone by your bedside.  Have a firm chair with side arms to use for getting dressed.  Remove throw rugs and tripping hazards from the floor. KITCHEN  Keep handles on pots and pans turned toward the center of the stove. Use back burners when possible.  Clean up spills quickly and allow time for drying.  Avoid walking on wet floors.  Avoid hot utensils and knives.  Position shelves so they are not too high or low.  Place commonly used objects within easy reach.  If necessary, use a sturdy step stool with a grab bar when  reaching.  Keep electrical cables out of the way.  Do not use floor polish or wax that makes floors slippery. If you must use wax, use non-skid floor wax.  Remove throw rugs and tripping hazards from the floor. STAIRWAYS  Never leave objects on stairs.  Place handrails on both sides of stairways and use them. Fix any loose handrails. Make sure handrails on both sides of the stairways are as long as the stairs.  Check carpeting to make sure it is firmly attached along stairs. Make repairs to worn or loose carpet promptly.  Avoid placing throw rugs at the top or bottom of stairways, or properly secure the rug with carpet tape to prevent slippage. Get rid of throw rugs, if possible.  Have an electrician put in a light switch at the top and bottom of the stairs. OTHER FALL PREVENTION TIPS  Wear low-heel or rubber-soled shoes that are supportive and fit well. Wear closed toe shoes.  When using a stepladder, make sure it is fully opened and both spreaders are firmly locked. Do not climb a closed stepladder.  Add color or contrast paint or tape to grab bars and handrails in your home. Place contrasting color strips on first and last steps.  Learn and use mobility aids as needed. Install an electrical emergency response system.  Turn on lights to avoid dark areas. Replace light bulbs that burn out immediately. Get light switches that glow.  Arrange furniture to create clear pathways. Keep furniture in the same place.  Firmly attach carpet with non-skid or double-sided tape.  Eliminate uneven floor surfaces.  Select a carpet pattern that does not visually hide the edge of steps.  Be aware of all pets. OTHER HOME SAFETY TIPS  Set the water temperature for 120 F (48.8 C).  Keep emergency numbers on or near the telephone.  Keep smoke detectors on every level of the home and near sleeping areas. Document Released: 10/27/2002 Document Revised: 05/07/2012 Document Reviewed:  01/26/2012 [facility] Patient Information 2014 [place], [place].

## 2017-06-11 NOTE — Progress Notes (Signed)
     Subjective: 2 Days Post-Op Procedure(s) (LRB): OPEN REDUCTION INTERNAL FIXATION (ORIF) ULNAR FRACTURE INCISION AND DRAINAGE EXAM UNDER ANESTHESIA RADIAL HEAD (Left)Awake, alert and oriented x 4. Lab no growth one day, gram stain NOS. Day # 2.5 Ancef and Gentamycin  Patient reports pain as moderate.    Objective:   VITALS:  Temp:  [98 F (36.7 C)-98.7 F (37.1 C)] 98.7 F (37.1 C) (07/23 0448) Pulse Rate:  [84-91] 85 (07/23 0448) Resp:  [14-16] 16 (07/23 0448) BP: (103-111)/(57-67) 103/62 (07/23 0448) SpO2:  [99 %-100 %] 99 % (07/23 0448)  Neurologically intact ABD soft Neurovascular intact Sensation intact distally Intact pulses distally Dorsiflexion/Plantar flexion intact Incision: scant drainage No cellulitis present Compartment soft Dressing changed, mild ecchymosis about open wound I&D site no purulence, new xeroform 4x4 and ABD.   LABS  Recent Labs  06/08/17 2217 06/10/17 0523  HGB 12.4 9.4*  WBC 14.2* 11.8*  PLT 301 224    Recent Labs  06/08/17 2217 06/09/17 1346  NA 139 138  K 3.8 4.3  CL 103 106  CO2 24 25  BUN 11 9  CREATININE 0.75 0.73  GLUCOSE 103* 129*   No results for input(s): LABPT, INR in the last 72 hours.   Assessment/Plan: 2 Days Post-Op Procedure(s) (LRB): OPEN REDUCTION INTERNAL FIXATION (ORIF) ULNAR FRACTURE INCISION AND DRAINAGE EXAM UNDER ANESTHESIA RADIAL HEAD (Left)  Advance diet Up with therapy D/C IV fluids and d/c antibiotics. Discharge home with follow up in my office Friday AM at 9 AM I will write orders and prescriptions and call when the lab report culture are available. Kerrin ChampagneJames E Zianna Dercole 06/11/2017, 8:19 AM Patient ID: Karla Nichols, female   DOB: 10/12/1986, 31 y.o.   MRN: [id number]

## 2017-06-12 NOTE — Discharge Summary (Signed)
Physician Discharge Summary      Patient ID: Karla Nichols MRN: [id number] DOB/AGE: [birth year] 31 y.o.  Admit date: 06/08/2017 Discharge date: 06/11/2017  Admission Diagnoses:  Principal Problem:   Radius/ulna fracture, left, open type I or II, initial encounter   Discharge Diagnoses:  Same  Past Medical History:  Diagnosis Date  . Abnormality of gait   . Gait abnormality   . Migraine without aura, without mention of intractable migraine without mention of status migrainosus     Surgeries: Procedure(s): OPEN REDUCTION INTERNAL FIXATION (ORIF) ULNAR FRACTURE INCISION AND DRAINAGE EXAM UNDER ANESTHESIA RADIAL HEAD on 06/08/2017 - 06/09/2017   Consultants: None  Discharged Condition: Improved  Hospital Course: ZEKIAH COEN is an 31 y.o. female who was admitted 06/08/2017 with a chief complaint of  Chief Complaint  Patient presents with  . Arm Injury  , and found to have a diagnosis of Radius/ulna fracture, left, open type I or II, initial encounter.  She was brought to the operating room on 06/08/2017 - 06/09/2017 and underwent the above named procedures.     She was given perioperative antibiotics:  Anti-infectives    Start     Dose/Rate Route Frequency Ordered Stop   06/09/17 2000  gentamicin (GARAMYCIN) 400 mg in dextrose 5 % 100 mL IVPB  Status:  Discontinued     7 mg/kg  57.6 kg (Adjusted) 110 mL/hr over 60 Minutes Intravenous Every 24 hours 06/09/17 1601 06/11/17 1616   06/09/17 0600  ceFAZolin (ANCEF) IVPB 2g/100 mL premix  Status:  Discontinued     2 g 200 mL/hr over 30 Minutes Intravenous On call to O.R. 06/09/17 0344 06/09/17 0430   06/09/17 0216  polymyxin B 500,000 Units, bacitracin 50,000 Units in sodium chloride 0.9 % 500 mL irrigation  Status:  Discontinued       As needed 06/09/17 0216 06/09/17 0337   06/08/17 2345  ceFAZolin (ANCEF) IVPB 1 g/50 mL premix  Status:  Discontinued     1 g 100 mL/hr over 30 Minutes Intravenous Every 8 hours 06/08/17 2330  06/11/17 1616   06/08/17 2340  gentamicin (GARAMYCIN) 290 mg in dextrose 5 % 100 mL IVPB  Status:  Discontinued     5 mg/kg  57.6 kg (Adjusted) 107.3 mL/hr over 60 Minutes Intravenous Every 24 hours 06/08/17 2341 06/09/17 1601   06/08/17 2200  ceFAZolin (ANCEF) IVPB 1 g/50 mL premix     1 g 100 mL/hr over 30 Minutes Intravenous  Once 06/08/17 2153 06/08/17 2246    In the ER at [facility] she was evaluated and found to have a small punctate open wound over the volar proximal 1/3 volar forearm. This was dressed with a dry dressing but was determined to lkely be an open wound to the ulna fracture site. A steady amount of bloody drainage persisted. A small abrasion over the posterior left  Olecranon felt to be superficial. She was taken to the OR from the ER for I&D of the open ulna fracture site and examination of the radius fracture under general anesthesia. The volar wound did extend to the bone structures verifying the nature of the type fracture as open grade I-II with significant soft tissue muscle trauma. The open wound site drained with a penrose. Cultures were obtained at the time of surgery. She received ancef and gentamycin in the ER and this was continued post operatively. Intitial WBC on CBC was 14 K and this decreased on POD#1 to 11k, she remained afebrile post op  and was mobilized to a bedside recliner and walking in the hallway with assistance. The dressing was changed on POD#1 and the penrose drain discontinued. On POD #2 the dressing again changed and showed good granulating appearance to the I&Ded volar forearm wound site. The cultures showed no organisms seen on the gram stain and cultures anaerobic and aerobic no growth. IV antibiotics were discontinued and plans for discharge home. A post operative radiographs of the left forearm showed the previously inpacted radial neck fracture now nearly out to length with minimal medial displacement of the proximal radial diaphysis on the radial neck of  1.58mm. This was reviewed with several colleagues and recommendations Are for continued conservative treatment as her ROM was normal with intraoperative evaluation of the radiocapitellum joint Then was no block to supination or pronation. 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 A program directed at balance and coordination with be undertaken as a part of her post operative care.    She was given sequential compression devices, early ambulation, and chemoprophylaxis for DVT prophylaxis.   She benefited maximally from their hospital stay and there were no complications.    Recent vital signs:  Vitals:   06/10/17 2123 06/11/17 0448  BP: 111/67 103/62  Pulse: 84 85  Resp: 14 16  Temp: 98.6 F (37 C) 98.7 F (37.1 C)    Recent laboratory studies:  Results for orders placed or performed during the hospital encounter of 06/08/17  Aerobic/Anaerobic Culture (surgical/deep wound)  Result Value Ref Range   Specimen Description WOUND LEFT FOREARM    Special Requests NONE    Gram Stain      RARE WBC PRESENT,BOTH PMN AND MONONUCLEAR NO ORGANISMS SEEN    Culture      NO GROWTH 3 DAYS NO ANAEROBES ISOLATED; CULTURE IN PROGRESS FOR 5 DAYS Performed at [facility], [address] [place], [place] [address]    Report Status PENDING   Basic metabolic panel  Result Value Ref Range   Sodium 139 135 - 145 mmol/L   Potassium 3.8 3.5 - 5.1 mmol/L   Chloride 103 101 - 111 mmol/L   CO2 24 22 - 32 mmol/L   Glucose, Bld 103 (H) 65 - 99 mg/dL   BUN 11 6 - 20 mg/dL   Creatinine, Ser 6.04 0.44 - 1.00 mg/dL   Calcium 9.2 8.9 - 54.0 mg/dL   GFR calc non Af Amer >60 >60 mL/min   GFR calc Af Amer >60 >60 mL/min   Anion gap 12 5 - 15  CBC with Differential  Result Value Ref Range   WBC 14.2 (H) 4.0 - 10.5 K/uL   RBC 4.52 3.87 - 5.11 MIL/uL   Hemoglobin 12.4 12.0 - 15.0 g/dL   HCT 98.1 19.1 -  47.8 %   MCV 81.9 78.0 - 100.0 fL   MCH 27.4 26.0 - 34.0 pg   MCHC 33.5 30.0 - 36.0 g/dL   RDW 29.5 62.1 - 30.8 %   Platelets 301 150 - 400 K/uL   Neutrophils Relative % 84 %   Neutro Abs 11.9 (H) 1.7 - 7.7 K/uL   Lymphocytes Relative 12 %   Lymphs Abs 1.7 0.7 - 4.0 K/uL   Monocytes Relative 4 %   Monocytes Absolute 0.6 0.1 - 1.0 K/uL   Eosinophils Relative 0 %   Eosinophils Absolute 0.0 0.0 - 0.7 K/uL   Basophils Relative  0 %   Basophils Absolute 0.0 0.0 - 0.1 K/uL  Gentamicin level, random  Result Value Ref Range   Gentamicin Rm 1.0 ug/mL  Basic metabolic panel  Result Value Ref Range   Sodium 138 135 - 145 mmol/L   Potassium 4.3 3.5 - 5.1 mmol/L   Chloride 106 101 - 111 mmol/L   CO2 25 22 - 32 mmol/L   Glucose, Bld 129 (H) 65 - 99 mg/dL   BUN 9 6 - 20 mg/dL   Creatinine, Ser 1.61 0.44 - 1.00 mg/dL   Calcium 8.9 8.9 - 09.6 mg/dL   GFR calc non Af Amer >60 >60 mL/min   GFR calc Af Amer >60 >60 mL/min   Anion gap 7 5 - 15  CBC with Differential/Platelet  Result Value Ref Range   WBC 11.8 (H) 4.0 - 10.5 K/uL   RBC 3.44 (L) 3.87 - 5.11 MIL/uL   Hemoglobin 9.4 (L) 12.0 - 15.0 g/dL   HCT 04.5 (L) 40.9 - 81.1 %   MCV 84.9 78.0 - 100.0 fL   MCH 27.3 26.0 - 34.0 pg   MCHC 32.2 30.0 - 36.0 g/dL   RDW 91.4 78.2 - 95.6 %   Platelets 224 150 - 400 K/uL   Neutrophils Relative % 56 %   Neutro Abs 6.7 1.7 - 7.7 K/uL   Lymphocytes Relative 32 %   Lymphs Abs 3.8 0.7 - 4.0 K/uL   Monocytes Relative 10 %   Monocytes Absolute 1.2 (H) 0.1 - 1.0 K/uL   Eosinophils Relative 1 %   Eosinophils Absolute 0.1 0.0 - 0.7 K/uL   Basophils Relative 0 %   Basophils Absolute 0.0 0.0 - 0.1 K/uL  I-Stat beta hCG blood, ED  Result Value Ref Range   I-stat hCG, quantitative <5.0 <5 mIU/mL   Comment 3            Discharge Medications:   Allergies as of 06/11/2017   No Known Allergies     Medication List    STOP taking these medications   ibuprofen 200 MG tablet Commonly known as:   ADVIL,MOTRIN     TAKE these medications   cetirizine 10 MG tablet Commonly known as:  ZYRTEC Take 10 mg by mouth daily as needed for allergies.   cholecalciferol 1000 units tablet Commonly known as:  VITAMIN D Take 1,000 Units by mouth daily.   docusate sodium 100 MG capsule Commonly known as:  COLACE Take 1 capsule (100 mg total) by mouth 2 (two) times daily.   methocarbamol 500 MG tablet Commonly known as:  ROBAXIN Take 1 tablet (500 mg total) by mouth every 8 (eight) hours as needed for muscle spasms.   MULTIVITAMIN ADULTS Tabs Take 1 tablet by mouth daily.   oxyCODONE-acetaminophen 5-325 MG tablet Commonly known as:  PERCOCET/ROXICET Take 1-2 tablets by mouth every 6 (six) hours as needed for severe pain. What changed:  when to take this  reasons to take this       Diagnostic Studies: Dg Chest 2 View  Result Date: 06/08/2017 CLINICAL DATA:  Initial evaluation for preoperative evaluation for left forearm. EXAM: CHEST  2 VIEW COMPARISON:  None. FINDINGS: The heart size and mediastinal contours are within normal limits. Both lungs are clear. The visualized skeletal structures are unremarkable. IMPRESSION: No active cardiopulmonary disease. Electronically Signed   By: Rise Mu M.D.   On: 06/08/2017 23:06   Dg Elbow Complete Left  Result Date: 06/08/2017 CLINICAL DATA:  Ulnar-sided pain after  fall. EXAM: LEFT ELBOW - COMPLETE 3+ VIEW COMPARISON:  None. FINDINGS: Acute, closed, comminuted and impacted radial neck fracture without joint dislocation. Acute, closed, oblique fracture of the proximal ulna at the junction of the proximal and middle third, undermining the proximal margin of a pre-existing fixation plate for the ulna. This fracture exposes the tip of the first proximal fixation screw. A fixation screw is also noted spanning a chronic fracture of the midshaft of the radius with small ununited ossific bony fragment seen posteriorly. IMPRESSION: 1. Acute,  closed, comminuted and impacted radial neck fracture without joint dislocation. 2. Acute, closed, oblique fracture of the proximal ulna at the junction of the proximal and middle third, undermining the proximal margin of a pre-existing fixation plate for the ulna. This fracture exposes the tip of the first proximal fixation screw. 3. Chronic midshaft fracture of the radius with intact fixation plate and screws and small ununited ossific fragment noted posteriorly. Electronically Signed   By: Tollie Eth M.D.   On: 06/08/2017 20:41   Dg Forearm Left  Result Date: 06/09/2017 CLINICAL DATA:  Followup ORIF EXAM: LEFT FOREARM - 2 VIEW COMPARISON:  06/08/2017 FINDINGS: ORIF of ulnar fracture replacement of second plate and screws. Anatomic alignment. Previously treated radial fracture midshaft. Radial head and neck fracture, with less impaction. No hardware in that region. IMPRESSION: ORIF of proximal ulnar fracture.  Anatomic alignment. Better position of the radial head fracture fragments. Electronically Signed   By: Paulina Fusi M.D.   On: 06/09/2017 16:19   Dg Forearm Left  Result Date: 06/08/2017 CLINICAL DATA:  Left forearm pain along the ulnar aspect radiating to elbow after fall today. EXAM: LEFT FOREARM - 2 VIEW COMPARISON:  06/13/2016 FINDINGS: Soft tissue swelling of the forearm overlying a new fracture at the junction of the proximal and middle third of the ulna with one shaft width volar and medial displacement of the distal fracture fragment. This fracture undermines the proximal margin of the fixation plate and extends obliquely to the tip of the first proximal fixation screw which is now exposed by the new fracture. New impacted and slightly comminuted radial neck fracture is noted. Midshaft healing fractures of the radius and ulna are noted transfixed by plate and screws. No fracture of the fixation hardware. IMPRESSION: 1. New acute fracture at the junction of the proximal middle third of the  ulna undermining the proximal margin of a pre-existing fixation plate for the ulna and extending obliquely to expose the tip of the first fixation screw. One shaft width volar and medial displacement is noted of the distal fracture fragment. 2. New impacted and slightly comminuted radial neck fracture. 3. Fixation plate and screws about chronic midshaft fractures of the radius and ulna appear intact without evidence of hardware failure or hardware fracture. Electronically Signed   By: Tollie Eth M.D.   On: 06/08/2017 20:34   Dg Wrist Complete Left  Result Date: 06/08/2017 CLINICAL DATA:  Ulnar-sided forearm pain after fall today. EXAM: LEFT WRIST - COMPLETE 3+ VIEW COMPARISON:  06/13/2016 FINDINGS: There is no evidence of fracture or dislocation. Slight ulnar minus variance. There is no evidence of arthropathy or other focal bone abnormality. Fixation plates and screws are partially included of the mid radius and ulna. Soft tissues are unremarkable. IMPRESSION: No fracture or dislocation of the left wrist.  Ulnar minus variance. Electronically Signed   By: Tollie Eth M.D.   On: 06/08/2017 20:43    Disposition: 01-Home or Self Care  Discharge Instructions    Call MD / Call 911    Complete by:  As directed    If you experience chest pain or shortness of breath, CALL 911 and be transported to the hospital emergency room.  If you develope a fever above 101 F, pus (white drainage) or increased drainage or redness at the wound, or calf pain, call your surgeon's office.   Constipation Prevention    Complete by:  As directed    Drink plenty of fluids.  Prune juice may be helpful.  You may use a stool softener, such as Colace (over the counter) 100 mg twice a day.  Use MiraLax (over the counter) for constipation as needed.   Diet - low sodium heart healthy    Complete by:  As directed    Discharge instructions    Complete by:  As directed    Cast or Splint Care, Adult Casts and splints are supports  that are worn to protect broken bones and other injuries. A cast or splint may hold a bone still and in the correct position while it heals. Casts and splints may also help to ease pain, swelling, and muscle spasms. How to care for your cast Do not stick anything inside the cast to scratch your skin. Check the skin around the cast every day. Tell your doctor about any concerns. You may put lotion on dry skin around the edges of the cast. Do not put lotion on the skin under the cast. Keep the cast clean. If the cast is not waterproof: Do not let it get wet. Cover it with a watertight covering when you take a bath or a shower. How to care for your splint Wear it as told by your doctor. Take it off only as told by your doctor. Loosen the splint if your fingers or toes tingle, get numb, or turn cold and blue. Keep the splint clean. If the splint is not waterproof: Do not let it get wet. Cover it with a watertight covering when you take a bath or a shower. Follow these instructions at home: Bathing Do not take baths or swim until your doctor says it is okay. Ask your doctor if you can take showers. You may only be allowed to take sponge baths for bathing. If your cast or splint is not waterproof, cover it with a watertight covering when you take a bath or shower. Managing pain, stiffness, and swelling Move your fingers or toes often to avoid stiffness and to lessen swelling. Raise (elevate) the injured area above the level of your heart while sitting or lying down. Safety Do not use the injured limb to support your body weight until your doctor says that it is okay. Use crutches or other assistive devices as told by your doctor. General instructions Do not put pressure on any part of the cast or splint until it is fully hardened. This may take many hours. Return to your normal activities as told by your doctor. Ask your doctor what activities are safe for you. Keep all follow-up visits as  told by your doctor. This is important. Contact a doctor if: Your cast or splint gets damaged. The skin around the cast gets red or raw. The skin under the cast is very itchy or painful. Your cast or splint feels very uncomfortable. Your cast or splint is too tight or too loose. Your cast becomes wet or it starts to have a soft spot or area. You get an object stuck  under your cast. Get help right away if: Your pain gets worse. The injured area tingles, gets numb, or turns blue and cold. The part of your body above or below the cast is swollen and it turns a different color (is discolored). You cannot feel or move your fingers or toes. There is fluid leaking through the cast. You have very bad pain or pressure under the cast. You have trouble breathing. You have shortness of breath. You have chest pain. This information is not intended to replace advice given to you by your health care provider. Make sure you discuss any questions you have with your health care provider. Document Released: 03/08/2011 Document Revised: 10/27/2016 Document Reviewed: 10/27/2016 Elsevier Interactive Patient Education  2017 [employer]  Elbow Fracture Treated With ORIF, Care After Refer to this sheet in the next few weeks. These instructions provide you with information about caring for yourself after your procedure. Your health care provider may also give you more specific instructions. Your treatment has been planned according to current medical practices, but problems sometimes occur. Call your health care provider if you have any problems or questions after your procedure. What can I expect after the procedure? After the procedure, it is common to have: Soreness. Mild swelling. Stiffness.  Follow these instructions at home: If you have a splint: Wear it as directed by your health care provider. Remove it only as directed by your health care provider. Loosen the splint if your fingers become numb  and tingle, or if they turn cold and blue. Bathing Do not take baths, swim, or use a hot tub until your health care provider approves. Ask your health care provider if you can take showers. You may only be allowed to take sponge baths for bathing. If your health care provider approves bathing and showering, cover the splint with a watertight plastic bag to protect it from water. Do not let the splint get wet. If you were given a removable splint to wear, only remove it for bathing as directed by your health care provider. Keep the bandage (dressing) dry until your health care provider says it can be removed. Managing pain, stiffness, and swelling Move your fingers often to avoid stiffness and to lessen swelling. Raise the injured area above the level of your heart while you are sitting or lying down. If directed, apply ice to the incision: Put ice in a plastic bag. Place a towel between your skin and the bag. Leave the ice on for 20 minutes, 2-3 times per day. Driving Do not drive or operate heavy machinery while taking pain medicine. Do not drive while wearing a splint on a hand that you use for driving. Activity Return to your normal activities as directed by your health care provider. Ask your health care provider what activities are safe for you. Perform range-of-motion exercises only as directed by your health care provider. Incision care There are many different ways to close and cover an incision, including stitches (sutures), staples, and adhesive strips. Follow instructions from your health care provider about: Incision care. Dressing changes and removal. Incision closure removal. Check your incision area every day for signs of infection. Watch for: Redness, swelling, or pain. Fluid, blood, or pus. General instructions Do not put pressure on any part of the splint until it is fully hardened. This may take several hours. If you were given a sling, wear it as directed by your  health care provider. Keep the splint clean and dry. Do not use  any tobacco products, including cigarettes, chewing tobacco, or electronic cigarettes. Tobacco can delay bone healing. If you need help quitting, ask your health care provider. Take medicines only as directed by your health care provider. Keep all follow-up visits as directed by your health care provider. This is important. Do not use the injured limb to support your body weight until your health care provider says that you can. Contact a health care provider if: You have redness, swelling, or pain at the site of your incision. You have fluid, blood, or pus coming from your incision. You have a fever or chills. You notice a bad smell coming from your incision or your dressing. The edges of your incision break open after the sutures or staples have been removed. Get help right away if: You develop a rash. You have difficulty breathing. You have numbness or tingling in your hand or forearm. This information is not intended to replace advice given to you by your health care provider. Make sure you discuss any questions you have with your health care provider. Document Released: 05/26/2005 Document Revised: 04/03/2016 Document Reviewed: 11/02/2014 Elsevier Interactive Patient Education  2018 [employer]    Keep dressing dry. Elevated elbow above heart. Apply ice to backside of forearm two hours on and one half hour off for 48 hours. May Apply ice at night and go to sleep with out changing. Be sure to keep ice off fingers to prevent frost bite.  Return to office in two weeks for incision check and to start exercises. Dr. Clarisa KindredWillis's notes indicate that the trouble with balance and coordination with walking may be progressive and is likely genetic.  Fall Prevention and Home Safety Falls cause injuries and can affect all age groups. It is possible to use preventive measures to significantly decrease the likelihood of falls. There  are many simple measures which can make your home safer and prevent falls. OUTDOORS Repair cracks and edges of walkways and driveways. Remove high doorway thresholds. Trim shrubbery on the main path into your home. Have good outside lighting. Clear walkways of tools, rocks, debris, and clutter. Check that handrails are not broken and are securely fastened. Both sides of steps should have handrails. Have leaves, snow, and ice cleared regularly. Use sand or salt on walkways during winter months. In the garage, clean up grease or oil spills. BATHROOM Install night lights. Install grab bars by the toilet and in the tub and shower. Use non-skid mats or decals in the tub or shower. Place a plastic non-slip stool in the shower to sit on, if needed. Keep floors dry and clean up all water on the floor immediately. Remove soap buildup in the tub or shower on a regular basis. Secure bath mats with non-slip, double-sided rug tape. Remove throw rugs and tripping hazards from the floors. BEDROOMS Install night lights. Make sure a bedside light is easy to reach. Do not use oversized bedding. Keep a telephone by your bedside. Have a firm chair with side arms to use for getting dressed. Remove throw rugs and tripping hazards from the floor. KITCHEN Keep handles on pots and pans turned toward the center of the stove. Use back burners when possible. Clean up spills quickly and allow time for drying. Avoid walking on wet floors. Avoid hot utensils and knives. Position shelves so they are not too high or low. Place commonly used objects within easy reach. If necessary, use a sturdy step stool with a grab bar when reaching. Keep electrical cables out  of the way. Do not use floor polish or wax that makes floors slippery. If you must use wax, use non-skid floor wax. Remove throw rugs and tripping hazards from the floor. STAIRWAYS Never leave objects on stairs. Place handrails on both sides of  stairways and use them. Fix any loose handrails. Make sure handrails on both sides of the stairways are as long as the stairs. Check carpeting to make sure it is firmly attached along stairs. Make repairs to worn or loose carpet promptly. Avoid placing throw rugs at the top or bottom of stairways, or properly secure the rug with carpet tape to prevent slippage. Get rid of throw rugs, if possible. Have an electrician put in a light switch at the top and bottom of the stairs. OTHER FALL PREVENTION TIPS Wear low-heel or rubber-soled shoes that are supportive and fit well. Wear closed toe shoes. When using a stepladder, make sure it is fully opened and both spreaders are firmly locked. Do not climb a closed stepladder. Add color or contrast paint or tape to grab bars and handrails in your home. Place contrasting color strips on first and last steps. Learn and use mobility aids as needed. Install an electrical emergency response system. Turn on lights to avoid dark areas. Replace light bulbs that burn out immediately. Get light switches that glow. Arrange furniture to create clear pathways. Keep furniture in the same place. Firmly attach carpet with non-skid or double-sided tape. Eliminate uneven floor surfaces. Select a carpet pattern that does not visually hide the edge of steps. Be aware of all pets. OTHER HOME SAFETY TIPS Set the water temperature for 120 F (48.8 C). Keep emergency numbers on or near the telephone. Keep smoke detectors on every level of the home and near sleeping areas. Document Released: 10/27/2002 Document Revised: 05/07/2012 Document Reviewed: 01/26/2012 [facility] Patient Information 2014 [place], [place].    Driving restrictions    Complete by:  As directed    No driving for 6 weeks   Increase activity slowly as tolerated    Complete by:  As directed    Lifting restrictions    Complete by:  As directed    No lifting for 8 weeks      Follow-up Information     Kerrin Champagne, MD Follow up in 4 week(s).   Specialty:  Orthopedic Surgery Why:  For wound re-check, Friday at 9 AM  Contact information: [address] [place] [address] [phone]            Signed: Kerrin Champagne 06/12/2017, 1:15 PM

## 2017-06-14 LAB — AEROBIC/ANAEROBIC CULTURE (SURGICAL/DEEP WOUND): CULTURE: NO GROWTH

## 2017-06-14 LAB — AEROBIC/ANAEROBIC CULTURE W GRAM STAIN (SURGICAL/DEEP WOUND)

## 2017-06-15 ENCOUNTER — Encounter (INDEPENDENT_AMBULATORY_CARE_PROVIDER_SITE_OTHER): Payer: Self-pay | Admitting: Specialist

## 2017-06-15 ENCOUNTER — Ambulatory Visit (INDEPENDENT_AMBULATORY_CARE_PROVIDER_SITE_OTHER): Payer: Self-pay

## 2017-06-15 ENCOUNTER — Telehealth (INDEPENDENT_AMBULATORY_CARE_PROVIDER_SITE_OTHER): Payer: Self-pay | Admitting: Specialist

## 2017-06-15 ENCOUNTER — Ambulatory Visit (INDEPENDENT_AMBULATORY_CARE_PROVIDER_SITE_OTHER): Payer: BLUE CROSS/BLUE SHIELD | Admitting: Specialist

## 2017-06-15 VITALS — BP 130/80 | HR 87 | Ht 63.5 in | Wt 140.0 lb

## 2017-06-15 DIAGNOSIS — S5292XD Unspecified fracture of left forearm, subsequent encounter for closed fracture with routine healing: Secondary | ICD-10-CM

## 2017-06-15 DIAGNOSIS — S52121D Displaced fracture of head of right radius, subsequent encounter for closed fracture with routine healing: Secondary | ICD-10-CM

## 2017-06-15 DIAGNOSIS — S52002E Unspecified fracture of upper end of left ulna, subsequent encounter for open fracture type I or II with routine healing: Secondary | ICD-10-CM

## 2017-06-15 NOTE — Patient Instructions (Signed)
Plan: Sling for left arm when up and about. PT to start next week with ROM of the left forearm then return to splint twice a day. Return in one week for follow up.

## 2017-06-15 NOTE — Progress Notes (Addendum)
   Post-Op Visit Note   Patient: Karla Nichols           Date of Birth: 05/26/1986           MRN: [id number] Visit Date: 06/15/2017 PCP: Farris HasMorrow, Aaron, MD   Assessment & Plan:  Chief Complaint:  Chief Complaint  Patient presents with  . Left Forearm - Routine Post Op, Wound Check   Visit Diagnoses:  1. Left forearm fracture, closed, with routine healing, subsequent encounter   2. Closed displaced fracture of head of right radius with routine healing, subsequent encounter   3. Type I or II open fracture of proximal end of left ulna with routine healing, unspecified fracture morphology, subsequent encounter   Left volar forearm wound with two staples intact granulating 100% no erythrema or drainage. Ecchymosis is present along the left medial elbow Probably secondary to hematoma traveling along the planes of the forearm to the medial elbow gravity dependent. Motor and sensory is normal. ROM pronation 60 and supination 30.   Plan: Sling for left arm when up and about. PT to start next week with ROM of the left forearm then return to splint twice a day. Return in one week for follow up. [facility] of the right elbow, removing splint and replacing post therapy. Will need therapy for balance and coordination as well. Follow-Up Instructions: Return in about 7 days (around 06/22/2017).   Orders:  Orders Placed This Encounter  Procedures  . XR Forearm Left  . Ambulatory referral to Home Health   No orders of the defined types were placed in this encounter.   Imaging: No results found.  PMFS History: Patient Active Problem List   Diagnosis Date Noted  . Radial head fracture, closed 06/11/2017    Priority: High    Class: Acute  . Radius/ulna fracture, left, open type I or II, initial encounter 06/08/2017    Priority: High    Class: Acute  . Forearm fractures, both bones, closed 06/08/2016  . Abnormality of gait 01/23/2014   Past Medical History:    Diagnosis Date  . Abnormality of gait   . Gait abnormality   . Migraine without aura, without mention of intractable migraine without mention of status migrainosus     Family History  Problem Relation Age of Onset  . Hypertension Mother   . Diabetes Mother     Past Surgical History:  Procedure Laterality Date  . NO PAST SURGERIES    . none    . ORIF ULNAR FRACTURE Left 06/13/2016   Procedure: OPEN REDUCTION INTERNAL FIXATION (ORIF) LEFT RADIUS AND ULNA FRACTURES;  Surgeon: Kathryne Hitchhristopher Y Blackman, MD;  Location: [facility];  Service: Orthopedics;  Laterality: Left;  . 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 Smoking status: Never Smoker  . Smokeless tobacco: Never Used  . Alcohol use No     Comment: occassional  . Drug use: No  . Sexual activity: Yes    Birth control/ protection: None

## 2017-06-15 NOTE — Telephone Encounter (Signed)
CAN YOU PLEASE OPEN UP ONE WEEK PER NITKA?

## 2017-06-21 ENCOUNTER — Telehealth (INDEPENDENT_AMBULATORY_CARE_PROVIDER_SITE_OTHER): Payer: Self-pay | Admitting: Specialist

## 2017-06-21 ENCOUNTER — Encounter (INDEPENDENT_AMBULATORY_CARE_PROVIDER_SITE_OTHER): Payer: Self-pay | Admitting: Specialist

## 2017-06-21 ENCOUNTER — Ambulatory Visit (INDEPENDENT_AMBULATORY_CARE_PROVIDER_SITE_OTHER): Payer: BLUE CROSS/BLUE SHIELD | Admitting: Specialist

## 2017-06-21 VITALS — BP 118/78 | HR 88 | Ht 63.5 in | Wt 140.0 lb

## 2017-06-21 DIAGNOSIS — R269 Unspecified abnormalities of gait and mobility: Secondary | ICD-10-CM

## 2017-06-21 DIAGNOSIS — S5292XB Unspecified fracture of left forearm, initial encounter for open fracture type I or II: Secondary | ICD-10-CM

## 2017-06-21 DIAGNOSIS — S52121S Displaced fracture of head of right radius, sequela: Secondary | ICD-10-CM

## 2017-06-21 DIAGNOSIS — S52202B Unspecified fracture of shaft of left ulna, initial encounter for open fracture type I or II: Secondary | ICD-10-CM

## 2017-06-21 LAB — HIV ANTIBODY (ROUTINE TESTING W REFLEX): HIV Screen 4th Generation wRfx: NONREACTIVE

## 2017-06-21 NOTE — Patient Instructions (Signed)
Wear the sling left arm. Have HHN see you for ROM exercises. Use the forearm brace half shell splint to protect the elbow between exercising of the left arm, after 2 weeks may discontinue the left forearm.

## 2017-06-21 NOTE — Progress Notes (Signed)
   Post-Op Visit Note   Patient: Karla Nichols           Date of Birth: 01/02/1986           MRN: [id number] Visit Date: 06/21/2017 PCP: Farris HasMorrow, Aaron, MD   Assessment & Plan: 2 weeks following ORIF left open ulna fracture 1 year following left forearm ORIF for both bones forearm fracture. Has plated ulna, intraop cultures are negative. Has an impacted Radial neck fracture with minimal displacement.   Chief Complaint:  Chief Complaint  Patient presents with  . Left Forearm - Routine Post Op, Follow-up, Wound Check  The I&D site over the volar proximal 1/3rd forearm is granulating 100% staples removed here and over ORIF ulna incision site. Steristrips applied, mupruicin cream and bandaid to the open wound site. ROM Elbow 15-130, Supination 70, pronation 60  Visit Diagnoses:  1. Radius/ulna fracture, left, open type I or II, initial encounter   2. Closed displaced fracture of head of right radius, sequela   3. Abnormality of gait     Plan: Wear the sling left arm. Have HHN see you for ROM exercises. Use the forearm brace half shell splint to protect the elbow between exercising of the left arm, after 2 weeks may discontinue the left forearm.  Follow-Up Instructions: Return in about 24 days (around 07/15/2017) for overbook.   Orders:  No orders of the defined types were placed in this encounter.  No orders of the defined types were placed in this encounter.   Imaging: No results found.  PMFS History: Patient Active Problem List   Diagnosis Date Noted  . Radial head fracture, closed 06/11/2017    Priority: High    Class: Acute  . Radius/ulna fracture, left, open type I or II, initial encounter 06/08/2017    Priority: High    Class: Acute  . Forearm fractures, both bones, closed 06/08/2016  . Abnormality of gait 01/23/2014   Past Medical History:  Diagnosis Date  . Abnormality of gait   . Gait abnormality   . Migraine without aura, without mention of intractable  migraine without mention of status migrainosus     Family History  Problem Relation Age of Onset  . Hypertension Mother   . Diabetes Mother     Past Surgical History:  Procedure Laterality Date  . NO PAST SURGERIES    . none    . ORIF ULNAR FRACTURE Left 06/13/2016   Procedure: OPEN REDUCTION INTERNAL FIXATION (ORIF) LEFT RADIUS AND ULNA FRACTURES;  Surgeon: Kathryne Hitchhristopher Y Blackman, MD;  Location[facility];  Service: Orthopedics;  Laterality: Left;  . 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 Smoking status: Never Smoker  . Smokeless tobacco: Never Used  . Alcohol use No     Comment: occassional  . Drug use: No  . Sexual activity: Yes    Birth control/ protection: None

## 2017-06-21 NOTE — Telephone Encounter (Signed)
CAN YOU MAKE PT APPT WITH NITKA FOR 07/16/17 AT 1:30 PER NITKA

## 2017-06-27 ENCOUNTER — Telehealth (INDEPENDENT_AMBULATORY_CARE_PROVIDER_SITE_OTHER): Payer: Self-pay | Admitting: *Deleted

## 2017-06-27 NOTE — Telephone Encounter (Signed)
Karla Nichols case manager called states they are a little confused on the recent order 06/21/17 and needs clarification as to what is needed. Does pt need nursing and PT or just PT?

## 2017-06-27 NOTE — Telephone Encounter (Signed)
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 Her most recent injury 3 weeks ago this Friday was an open fracture left forearm ulna proximal to the plate from last year and an impacted left Radial head and neck fracture. She is ordered for balance and coordination exercises to prevent a further fall and ROM of the left elbow and left forearm with grip strengthening only. [name] and she understands. jen

## 2017-06-27 NOTE — Telephone Encounter (Signed)
Melissa [facility] case manager called states they are a little confused on the recent order 06/21/17 and needs clarification as to what is needed. Does pt need nursing and PT or just PT?  Please advise!  CB # [phone] Melissa

## 2017-06-28 NOTE — Telephone Encounter (Signed)
Ok. Thanks!

## 2017-07-04 ENCOUNTER — Telehealth (INDEPENDENT_AMBULATORY_CARE_PROVIDER_SITE_OTHER): Payer: Self-pay | Admitting: Specialist

## 2017-07-04 NOTE — Telephone Encounter (Signed)
Patient called asked if Dr Otelia SergeantNitka will write a letter stating how long she will be out of work and state her condition. Patient asked if the letter could be faxed to her employer. Patient's employer is [employer]   The fax# is [phone]   The number to contact patient is [phone]

## 2017-07-05 NOTE — Telephone Encounter (Signed)
Patient called asked if Dr Otelia SergeantNitka will write a letter stating how long she will be out of work and state her condition. Patient asked if the letter could be faxed to her employer. Patient's employer is [employer]

## 2017-07-10 ENCOUNTER — Telehealth (INDEPENDENT_AMBULATORY_CARE_PROVIDER_SITE_OTHER): Payer: Self-pay | Admitting: Radiology

## 2017-07-10 NOTE — Telephone Encounter (Signed)
Dorene Sorrow with [facility] came by today, states that they have tried to reach out to the patient several times without any response from the patient.  He just wanted to let us know so that if patient calls and says no on e has tried to reach them that they have tried to reach out several times. Dorene Sorrow said that they can call the scheduler at [phone] to sched for someone to come out to her home.

## 2017-07-16 ENCOUNTER — Ambulatory Visit (INDEPENDENT_AMBULATORY_CARE_PROVIDER_SITE_OTHER): Payer: Self-pay

## 2017-07-16 ENCOUNTER — Encounter (INDEPENDENT_AMBULATORY_CARE_PROVIDER_SITE_OTHER): Payer: Self-pay | Admitting: Specialist

## 2017-07-16 ENCOUNTER — Ambulatory Visit (INDEPENDENT_AMBULATORY_CARE_PROVIDER_SITE_OTHER): Payer: BLUE CROSS/BLUE SHIELD | Admitting: Specialist

## 2017-07-16 VITALS — BP 122/84 | HR 98 | Ht 63.5 in | Wt 140.0 lb

## 2017-07-16 DIAGNOSIS — S5292XB Unspecified fracture of left forearm, initial encounter for open fracture type I or II: Secondary | ICD-10-CM

## 2017-07-16 DIAGNOSIS — S52202B Unspecified fracture of shaft of left ulna, initial encounter for open fracture type I or II: Secondary | ICD-10-CM | POA: Diagnosis not present

## 2017-07-16 MED ORDER — HYDROCODONE-ACETAMINOPHEN 5-325 MG PO TABS: 1.0000 | ORAL_TABLET | Freq: Four times a day (QID) | ORAL | 0 refills | Status: DC | PRN

## 2017-07-16 NOTE — Progress Notes (Signed)
   Post-Op Visit Note   Patient: TYKESHIA SHAEFER           Date of Birth: Feb 12, 1986           MRN: [id number] Visit Date: 07/16/2017 PCP: Farris Has, MD   Assessment & Plan:  Chief Complaint:  Chief Complaint  Patient presents with  . Left Forearm - Routine Post Op, Follow-up   Visit Diagnoses:  1. Radius/ulna fracture, left, open type I or II, initial encounter    Incisions are healed, some prominence to the proximal edge of the ulna plate. Supination 80 degrees and Pronation 60 degrees Elbow ROM is flexion 135 degrees, and extension -5 degrees.  Plan: No weight bearing on the left elbow. A hinged brace will be ordered to protect the elbow in ROM.  Meds for pain renewed.  Follow-Up Instructions: No Follow-up on file.   Orders:  Orders Placed This Encounter  Procedures  . XR Forearm Left   No orders of the defined types were placed in this encounter.   Imaging: Xr Forearm Left  Result Date: 07/16/2017 AP and lateral of the left forearm show plates and screws fixing the left proximal ulna in good position and Alignment. There is suggestion of early healing at the ulna fracture site. The radial head and neck fracture shows distraction on the AP view of the forearm with minimal healing of the radial aspect of the the fracture and lateral displacement of the radial head fragment.    PMFS History: Patient Active Problem List   Diagnosis Date Noted  . Radial head fracture, closed 06/11/2017    Priority: High    Class: Acute  . Radius/ulna fracture, left, open type I or II, initial encounter 06/08/2017    Priority: High    Class: Acute  . Forearm fractures, both bones, closed 06/08/2016  . Abnormality of gait 01/23/2014   Past Medical History:  Diagnosis Date  . Abnormality of gait   . Gait abnormality   . Migraine without aura, without mention of intractable migraine without mention of status migrainosus     Family History  Problem Relation Age of Onset  .  Hypertension Mother   . Diabetes Mother     Past Surgical History:  Procedure Laterality Date  . NO PAST SURGERIES    . none    . ORIF ULNAR FRACTURE Left 06/13/2016   Procedure: OPEN REDUCTION INTERNAL FIXATION (ORIF) LEFT RADIUS AND ULNA FRACTURES;  Surgeon: Kathryne Hitch, MD;  Location: [facility];  Service: Orthopedics;  Laterality: Left;  . 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 Smoking status: Never Smoker  . Smokeless tobacco: Never Used  . Alcohol use No     Comment: occassional  . Drug use: No  . Sexual activity: Yes    Birth control/ protection: None

## 2017-07-16 NOTE — Patient Instructions (Signed)
Plan: No weight bearing on the left elbow. A hinged brace will be ordered to protect the elbow in ROM.  Meds for pain renewed.

## 2017-08-09 ENCOUNTER — Encounter (INDEPENDENT_AMBULATORY_CARE_PROVIDER_SITE_OTHER): Payer: Self-pay | Admitting: Specialist

## 2017-08-09 ENCOUNTER — Ambulatory Visit (INDEPENDENT_AMBULATORY_CARE_PROVIDER_SITE_OTHER): Payer: BLUE CROSS/BLUE SHIELD | Admitting: Specialist

## 2017-08-09 VITALS — BP 119/76 | HR 90 | Ht 62.0 in | Wt 140.0 lb

## 2017-08-09 DIAGNOSIS — S52121A Displaced fracture of head of right radius, initial encounter for closed fracture: Secondary | ICD-10-CM

## 2017-08-09 DIAGNOSIS — S52202B Unspecified fracture of shaft of left ulna, initial encounter for open fracture type I or II: Secondary | ICD-10-CM

## 2017-08-09 DIAGNOSIS — S5292XB Unspecified fracture of left forearm, initial encounter for open fracture type I or II: Secondary | ICD-10-CM

## 2017-08-09 NOTE — Progress Notes (Signed)
   Post-Op Visit Note   Patient: Karla Nichols           Date of Birth: 04/21/1986           MRN: [id number] Visit Date: 08/09/2017 PCP: Farris Has, MD   Assessment & Plan: 2 months post ORIF left proximal ulna, closed treatment of left radial head fracture.  Chief Complaint:  Chief Complaint  Patient presents with  . Left Forearm - Follow-up   Visit Diagnoses: No diagnosis found.  Plan: Continue to move the left elbow for ROM and not weight bear on the left elbow.   Follow-Up Instructions: No Follow-up on file.   Orders:  No orders of the defined types were placed in this encounter.  No orders of the defined types were placed in this encounter.   Imaging: No results found.  PMFS History: Patient Active Problem List   Diagnosis Date Noted  . Radial head fracture, closed 06/11/2017    Priority: High    Class: Acute  . Radius/ulna fracture, left, open type I or II, initial encounter 06/08/2017    Priority: High    Class: Acute  . Forearm fractures, both bones, closed 06/08/2016  . Abnormality of gait 01/23/2014   Past Medical History:  Diagnosis Date  . Abnormality of gait   . Gait abnormality   . Migraine without aura, without mention of intractable migraine without mention of status migrainosus     Family History  Problem Relation Age of Onset  . Hypertension Mother   . Diabetes Mother     Past Surgical History:  Procedure Laterality Date  . NO PAST SURGERIES    . none    . ORIF ULNAR FRACTURE Left 06/13/2016   Procedure: OPEN REDUCTION INTERNAL FIXATION (ORIF) LEFT RADIUS AND ULNA FRACTURES;  Surgeon: Kathryne Hitch, MD;  Location: [facility];  Service: Orthopedics;  Laterality: Left;  . 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 Smoking status: Never Smoker  . Smokeless tobacco: Never Used  . Alcohol use No     Comment: occassional  . Drug use: No  . Sexual activity: Yes    Birth control/ protection: None

## 2017-08-09 NOTE — Patient Instructions (Signed)
Plan: Continue to move the left elbow for ROM and not weight bear on the left elbow.

## 2017-09-04 ENCOUNTER — Encounter (INDEPENDENT_AMBULATORY_CARE_PROVIDER_SITE_OTHER): Payer: Self-pay | Admitting: Specialist

## 2017-09-04 ENCOUNTER — Ambulatory Visit (INDEPENDENT_AMBULATORY_CARE_PROVIDER_SITE_OTHER): Payer: BLUE CROSS/BLUE SHIELD | Admitting: Specialist

## 2017-09-04 ENCOUNTER — Ambulatory Visit (INDEPENDENT_AMBULATORY_CARE_PROVIDER_SITE_OTHER): Payer: BLUE CROSS/BLUE SHIELD

## 2017-09-04 VITALS — BP 128/82 | HR 87 | Ht 62.0 in | Wt 140.0 lb

## 2017-09-04 DIAGNOSIS — S5292XB Unspecified fracture of left forearm, initial encounter for open fracture type I or II: Secondary | ICD-10-CM | POA: Diagnosis not present

## 2017-09-04 DIAGNOSIS — S52202B Unspecified fracture of shaft of left ulna, initial encounter for open fracture type I or II: Secondary | ICD-10-CM | POA: Diagnosis not present

## 2017-09-04 DIAGNOSIS — S52122K Displaced fracture of head of left radius, subsequent encounter for closed fracture with nonunion: Secondary | ICD-10-CM

## 2017-09-04 NOTE — Progress Notes (Addendum)
   Post-Op Visit Note   Patient: Karla Nichols           Date of Birth: 18-Mar-1986           MRN: [id number] Visit Date: 09/04/2017 PCP: Farris Has, MD   Assessment & Plan:  Chief Complaint: Nearly 3 months following left proximal ulna diaphysis fracture above plate from previous ORIF of both bone forearm fracture. 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   Chief Complaint  Patient presents with  . Left Forearm - Routine Post Op, Follow-up   Visit Diagnoses:  1. Radius/ulna fracture, left, open type I or II, initial encounter   2. 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 She is nontender over the left radial head.     Follow-Up Instructions: Return if symptoms worsen or fail to improve.  Plan: Avoid pushing with the left arm. May lift upwards of 20-25 lbs. May return to work.   Orders:  Orders Placed This Encounter  Procedures  . XR Forearm Left   No orders of the defined types were placed in this encounter.   Imaging: No results found.  PMFS History: Patient Active Problem List   Diagnosis Date Noted  . Radial head fracture, closed 06/11/2017    Priority: High    Class: Acute  . Radius/ulna fracture, left, open type I or II, initial encounter 06/08/2017    Priority: High    Class: Acute  . Forearm fractures, both bones, closed 06/08/2016  . Abnormality of gait 01/23/2014   Past Medical History:  Diagnosis Date  . Abnormality of gait   . Gait abnormality   . Migraine without aura, without mention of intractable migraine without mention of status migrainosus     Family History  Problem Relation Age of Onset  . Hypertension Mother   . Diabetes Mother     Past Surgical History:  Procedure Laterality Date  . NO PAST SURGERIES    . none    . ORIF ULNAR  FRACTURE Left 06/13/2016   Procedure: OPEN REDUCTION INTERNAL FIXATION (ORIF) LEFT RADIUS AND ULNA FRACTURES;  Surgeon: Kathryne Hitch, MD;  [facility]: [facility];  Service: Orthopedics;  Laterality: Left;  . ORIF ULNAR FRACTURE Left 06/09/2017   Procedure: OPEN REDUCTION INTERNAL FIXATION (ORIF) ULNAR FRACTURE INCISION AND DRAINAGE EXAM UNDER ANESTHESIA RADIAL HEAD;  Surgeon: Kerrin Champagne, MD;  Location: [facility];  Service: Orthopedics;  Laterality: Left;   Social History   Occupational History  . Occupation: Lobbyist: [employer]  Tobacco Use  . Smoking status: Never Smoker  . Smokeless tobacco: Never Used  Substance and Sexual Activity  . Alcohol use: No    Comment: occassional  . Drug use: No  . Sexual activity: Yes    Birth control/protection: None

## 2017-09-04 NOTE — Patient Instructions (Signed)
Plan: Avoid pushing with the left arm. May lift upwards of 20-25 lbs. May return to work.
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
## 2017-09-13 ENCOUNTER — Ambulatory Visit (INDEPENDENT_AMBULATORY_CARE_PROVIDER_SITE_OTHER): Payer: BLUE CROSS/BLUE SHIELD | Admitting: Surgery

## 2017-10-08 ENCOUNTER — Telehealth (INDEPENDENT_AMBULATORY_CARE_PROVIDER_SITE_OTHER): Payer: Self-pay | Admitting: Specialist

## 2017-10-08 NOTE — Telephone Encounter (Signed)
Patient needs a work note saying she can go back to work Thursday December 6th. Can you mail the work note to her new address:  [address] [place], [place] [address]  Patients CB # [phone]

## 2017-10-09 NOTE — Telephone Encounter (Signed)
I agree she can return to work duties on 10/25/2017, she has restrictions not to lift greater than 15-20 lbs with the left arm.

## 2017-10-09 NOTE — Telephone Encounter (Signed)
Patient needs a work note saying she can go back to work Thursday December 6th.

## 2017-10-10 ENCOUNTER — Encounter (INDEPENDENT_AMBULATORY_CARE_PROVIDER_SITE_OTHER): Payer: Self-pay | Admitting: Radiology

## 2017-10-10 NOTE — Telephone Encounter (Signed)
Note written and placed in the mail today.

## 2017-10-10 NOTE — Progress Notes (Signed)
Note requested to return to work.

## 2021-06-29 ENCOUNTER — Ambulatory Visit (INDEPENDENT_AMBULATORY_CARE_PROVIDER_SITE_OTHER): Payer: BLUE CROSS/BLUE SHIELD | Admitting: Orthopaedic Surgery

## 2021-06-29 ENCOUNTER — Ambulatory Visit: Payer: Self-pay

## 2021-06-29 DIAGNOSIS — M79672 Pain in left foot: Secondary | ICD-10-CM | POA: Diagnosis not present

## 2021-06-29 DIAGNOSIS — S92335A Nondisplaced fracture of third metatarsal bone, left foot, initial encounter for closed fracture: Secondary | ICD-10-CM

## 2021-06-29 DIAGNOSIS — S92325A Nondisplaced fracture of second metatarsal bone, left foot, initial encounter for closed fracture: Secondary | ICD-10-CM

## 2021-06-29 DIAGNOSIS — S92345A Nondisplaced fracture of fourth metatarsal bone, left foot, initial encounter for closed fracture: Secondary | ICD-10-CM

## 2021-06-29 NOTE — Progress Notes (Signed)
Office Visit Note   Patient: Karla Nichols           Date of Birth: 03-31-1986           MRN: [id number] Visit Date: 06/29/2021              Requested by: Farris Has, MD [address] [place],  [place] [address] PCP: Farris Has, MD   Assessment & Plan: Visit Diagnoses:  1. Pain in left foot   2. Closed nondisplaced fracture of second metatarsal bone of left foot, initial encounter   3. Closed nondisplaced fracture of third metatarsal bone of left foot, initial encounter   4. Closed nondisplaced fracture of fourth metatarsal bone of left foot, initial encounter     Plan:   I did show her the x-rays of her left foot and show her the fact that she does have subacute fractures of the second, third and fourth metatarsal of the left foot.  She can continue to weight-bear as tolerated and will need a postop shoe.  We will give her a note keeping her out of work for the next 4 weeks.  We do see her back in 4 weeks we will have a repeat 3 views of the left foot.  All questions and concerns were answered and addressed.  Follow-Up Instructions: Return in about 4 weeks (around 07/27/2021).   Orders:  Orders Placed This Encounter  Procedures   XR Foot Complete Left   No orders of the defined types were placed in this encounter.     Procedures: No procedures performed   Clinical Data: No additional findings.   Subjective: Chief Complaint  Patient presents with   Left Foot - Pain  The patient is a 35 year old female who is 5 weeks ago fell injuring her left foot.  She has a cam walking boot that she is walking and since then and using a walker.  She states this still bothers her quite a bit across her midfoot area.  She just the symptoms of strain and did not really go to a doctor.  She has not had x-rays of this foot.  She does work standing on her feet all day at [employer] and has been out of work due to the pain with her left foot.  She is nondiabetic.  She  denies any numbness tingling or other injuries.  She denies any ankle pain on the left side.  HPI  Review of Systems There is currently listed no headache, chest pain, shortness of breath, fever, chills, nausea, vomiting  Objective: Vital Signs: There were no vitals taken for this visit.  Physical Exam She is alert and orient x3 and in no acute distress Ortho Exam Examination of her left foot shows no bruising and only slight swelling.  The foot is ligamentously stable as is the ankle but there is pain along the midfoot. Specialty Comments:  No specialty comments available.  Imaging: XR Foot Complete Left  Result Date: 06/29/2021 3 views the left foot shows subacute nondisplaced fractures of the second, third, and fourth metatarsals.  There has been some interval healing.  There is no evidence of ligamentous disruption.    PMFS History: Patient Active Problem List   Diagnosis Date Noted   Radial head fracture, closed 06/11/2017    Class: Acute   Radius/ulna fracture, left, open type I or II, initial encounter 06/08/2017    Class: Acute   Forearm fractures, both bones, closed 06/08/2016  Abnormality of gait 01/23/2014   Past Medical History:  Diagnosis Date   Abnormality of gait    Gait abnormality    Migraine without aura, without mention of intractable migraine without mention of status migrainosus     Family History  Problem Relation Age of Onset   Hypertension Mother    Diabetes Mother     Past Surgical History:  Procedure Laterality Date   NO PAST SURGERIES     none     ORIF ULNAR FRACTURE Left 06/13/2016   Procedure: OPEN REDUCTION INTERNAL FIXATION (ORIF) LEFT RADIUS AND ULNA FRACTURES;  Surgeon: Kathryne Hitch, MD;  Location: [facility];  Service: Orthopedics;  Laterality: Left;   ORIF ULNAR FRACTURE Left 06/09/2017   Procedure: OPEN REDUCTION INTERNAL FIXATION (ORIF) ULNAR FRACTURE INCISION AND DRAINAGE EXAM UNDER ANESTHESIA RADIAL HEAD;  Surgeon: Kerrin Champagne, MD;  Location: [facility];  Service: Orthopedics;  Laterality: Left;   Social History   Occupational History   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

## 2021-07-05 ENCOUNTER — Telehealth: Payer: Self-pay | Admitting: Orthopaedic Surgery

## 2021-07-05 NOTE — Telephone Encounter (Signed)
Received $25.00 cash,medical records release form and disability paperwork/ Forwarding to [employer] today 

## 2021-07-14 ENCOUNTER — Telehealth: Payer: Self-pay | Admitting: Orthopaedic Surgery

## 2021-07-14 NOTE — Telephone Encounter (Signed)
[employer] forms received. To [employer]. °

## 2021-07-27 ENCOUNTER — Ambulatory Visit: Payer: Self-pay

## 2021-07-27 ENCOUNTER — Encounter: Payer: Self-pay | Admitting: Orthopaedic Surgery

## 2021-07-27 ENCOUNTER — Ambulatory Visit (INDEPENDENT_AMBULATORY_CARE_PROVIDER_SITE_OTHER): Payer: BLUE CROSS/BLUE SHIELD | Admitting: Orthopaedic Surgery

## 2021-07-27 DIAGNOSIS — M79672 Pain in left foot: Secondary | ICD-10-CM

## 2021-07-27 NOTE — Progress Notes (Signed)
The patient is a 35 year old who is now 9 weeks into an injury to her left foot in which she sustained metatarsal fractures of her second third and fourth metatarsal.  She is now transition to regular shoes.  She says that she is having some pain when she walks distances but overall is doing better.  She still has some mild pain when palpating over the second third and fourth metatarsals.  Her swelling is minimal.  She has good range of motion of foot and ankle.  3 views left foot compared to previous show abundant healing of the fractures.  There is good alignment overall.  They have not healed completely but are making excellent progress to go on to heal completely.  I will give her a note to return to half days with work starting September 16.  She can then resume full work duty however starting October 1.  She will avoid high impact aerobic activities for at least the next 6 weeks.  All questions and concerns were answered and addressed.  Follow-up is as needed.

## 2022-01-06 ENCOUNTER — Emergency Department (HOSPITAL_BASED_OUTPATIENT_CLINIC_OR_DEPARTMENT_OTHER): Payer: BLUE CROSS/BLUE SHIELD | Admitting: Radiology

## 2022-01-06 ENCOUNTER — Encounter (HOSPITAL_BASED_OUTPATIENT_CLINIC_OR_DEPARTMENT_OTHER): Payer: Self-pay

## 2022-01-06 ENCOUNTER — Emergency Department (HOSPITAL_BASED_OUTPATIENT_CLINIC_OR_DEPARTMENT_OTHER)
Admission: EM | Admit: 2022-01-06 | Discharge: 2022-01-07 | Disposition: A | Discharge status: Home / Self Care | Payer: BLUE CROSS/BLUE SHIELD | Attending: Emergency Medicine | Admitting: Emergency Medicine

## 2022-01-06 ENCOUNTER — Other Ambulatory Visit: Payer: Self-pay

## 2022-01-06 DIAGNOSIS — S59912A Unspecified injury of left forearm, initial encounter: Secondary | ICD-10-CM | POA: Diagnosis present

## 2022-01-06 DIAGNOSIS — W19XXXA Unspecified fall, initial encounter: Secondary | ICD-10-CM | POA: Insufficient documentation

## 2022-01-06 DIAGNOSIS — M79632 Pain in left forearm: Secondary | ICD-10-CM

## 2022-01-06 NOTE — ED Triage Notes (Signed)
Pt. States she fell at around 21:00 and injured her left arm. Pt. States she cam move arm. Rates pain at 5/10, swollen. Pt. States she did not take anything for pain. Pt. States she did not get hurt anywhere else.

## 2022-01-07 ENCOUNTER — Encounter (HOSPITAL_BASED_OUTPATIENT_CLINIC_OR_DEPARTMENT_OTHER): Payer: Self-pay | Admitting: Emergency Medicine

## 2022-01-07 MED ORDER — KETOROLAC TROMETHAMINE 60 MG/2ML IM SOLN
60.0000 mg | Freq: Once | INTRAMUSCULAR | Status: AC
  Administered 2022-01-07: 60 mg via INTRAMUSCULAR
  Filled 2022-01-07: qty 2

## 2022-01-07 MED ORDER — NAPROXEN 375 MG PO TABS: 375.0000 mg | ORAL_TABLET | Freq: Two times a day (BID) | ORAL | 0 refills | Status: DC

## 2022-01-07 NOTE — ED Provider Notes (Signed)
[facility] EMERGENCY DEPT Provider Note   CSN: [id number] Arrival date & time: 01/06/22  2201     History  Chief Complaint  Patient presents with   Arm Injury    Karla Nichols is a 36 y.o. female.  The history is provided by the patient.  Arm Injury Location:  Elbow and arm Arm location:  L forearm Elbow location:  L elbow Injury: yes   Time since incident:  3 hours Mechanism of injury: fall   Fall:    Fall occurred:  Standing   Impact surface:  Hard floor   Point of impact: L elbow.   Entrapped after fall: no   Pain details:    Quality:  Aching   Radiates to:  Does not radiate   Severity:  Moderate   Onset quality:  Sudden   Duration:  3 hours   Timing:  Constant   Progression:  Unchanged Dislocation: no   Foreign body present:  No foreign bodies Tetanus status:  Up to date Prior injury to area:  Yes Relieved by:  Nothing Worsened by:  Nothing Ineffective treatments:  None tried Associated symptoms: no back pain, no decreased range of motion and no fever   Risk factors: no concern for non-accidental trauma   Patient with previous L forearm surgery presents with fall and pain in left elbow and forearm.  Did not hit head, no LOC.      Home Medications Prior to Admission medications   Medication Sig Start Date End Date Taking? Authorizing Provider  naproxen (NAPROSYN) 375 MG tablet Take 1 tablet (375 mg total) by mouth 2 (two) times daily with a meal. 01/07/22  Yes Ilse Billman, MD  cetirizine (ZYRTEC) 10 MG tablet Take 10 mg by mouth daily as needed for allergies.     [provider]  cholecalciferol (VITAMIN D) 1000 units tablet Take 1,000 Units by mouth daily.    [provider]  docusate sodium (COLACE) 100 MG capsule Take 1 capsule (100 mg total) by mouth 2 (two) times daily. 06/11/17   Kerrin Champagne, MD  HYDROcodone-acetaminophen (NORCO/VICODIN) 5-325 MG tablet Take 1 tablet by mouth every 6 (six) hours as needed for  moderate pain. 07/16/17   Kerrin Champagne, MD  methocarbamol (ROBAXIN) 500 MG tablet Take 1 tablet (500 mg total) by mouth every 8 (eight) hours as needed for muscle spasms. 06/11/17   Kerrin Champagne, MD  Multiple Vitamins-Minerals (MULTIVITAMIN ADULTS) TABS Take 1 tablet by mouth daily.    [provider]  oxyCODONE-acetaminophen (PERCOCET/ROXICET) 5-325 MG tablet Take 1-2 tablets by mouth every 6 (six) hours as needed for severe pain. 06/11/17   Kerrin Champagne, MD      Allergies    Patient has no known allergies.    Review of Systems   Review of Systems  Constitutional:  Negative for fever.  HENT:  Negative for ear discharge.   Eyes:  Negative for redness.  Respiratory:  Negative for shortness of breath.   Cardiovascular:  Negative for chest pain.  Gastrointestinal:  Negative for abdominal pain.  Genitourinary:  Negative for difficulty urinating.  Musculoskeletal:  Positive for arthralgias. Negative for back pain.  Neurological:  Negative for facial asymmetry.  Psychiatric/Behavioral:  Negative for agitation.   All other systems reviewed and are negative.  Physical Exam Updated Vital Signs BP 110/62    Pulse (!) 102    Resp 18    Ht 5\' 3"  (1.6 m)    Wt  75.7 kg    LMP 12/16/2021 (Approximate)    SpO2 96%    BMI 29.57 kg/m  Physical Exam Vitals and nursing note reviewed. Exam conducted with a chaperone present.  Constitutional:      General: She is not in acute distress.    Appearance: Normal appearance.  HENT:     Head: Normocephalic and atraumatic.     Nose: Nose normal.  Eyes:     Conjunctiva/sclera: Conjunctivae normal.     Pupils: Pupils are equal, round, and reactive to light.  Cardiovascular:     Rate and Rhythm: Normal rate and regular rhythm.     Pulses: Normal pulses.     Heart sounds: Normal heart sounds.  Pulmonary:     Effort: Pulmonary effort is normal.     Breath sounds: Normal breath sounds.  Abdominal:     General: Abdomen is flat. Bowel sounds are  normal.     Palpations: Abdomen is soft.     Tenderness: There is no abdominal tenderness. There is no guarding.  Musculoskeletal:     Left upper arm: Normal.     Left elbow: Normal. No swelling, deformity, effusion or lacerations. Normal range of motion. No tenderness.     Left forearm: Normal. No swelling, edema, deformity, lacerations, tenderness or bony tenderness.     Left wrist: Normal. No tenderness, bony tenderness or snuff box tenderness. Normal range of motion.     Left hand: Normal. No deformity, lacerations or bony tenderness. Normal range of motion.     Cervical back: Normal range of motion and neck supple.  Skin:    General: Skin is warm and dry.     Capillary Refill: Capillary refill takes less than 2 seconds.  Neurological:     General: No focal deficit present.     Mental Status: She is alert and oriented to person, place, and time.     Deep Tendon Reflexes: Reflexes normal.  Psychiatric:        Mood and Affect: Mood normal.        Behavior: Behavior normal.    ED Results / Procedures / Treatments   Labs (all labs ordered are listed, but only abnormal results are displayed) Labs Reviewed - No data to display  EKG None  Radiology DG Elbow Complete Left  Result Date: 01/07/2022 CLINICAL DATA:  Status post fall. EXAM: LEFT ELBOW - COMPLETE 3+ VIEW COMPARISON:  September 04, 2017 FINDINGS: There is no evidence of an acute fracture, dislocation, or joint effusion. Radiopaque fixation plates and screws are seen along the proximal to mid shafts of the left radius and left ulna. A chronic deformity of the neck of the proximal left radius is also seen. There is no evidence of arthropathy or other focal bone abnormality. Soft tissues are unremarkable. IMPRESSION: 1. No acute fracture or dislocation. 2. Prior open reduction and internal fixation of the left radius and left ulna. Electronically Signed   By: Aram Candela M.D.   On: 01/07/2022 00:09   DG Forearm  Left  Result Date: 01/07/2022 CLINICAL DATA:  Recent fall with left arm pain, initial encounter EXAM: LEFT FOREARM - 2 VIEW COMPARISON:  09/04/2017 FINDINGS: Fixation side plates are noted in the midshaft of the radius and ulna stable in appearance from the prior exam. Prior proximal radial neck fracture with nonunion is seen the overall appearance is stable on the prior exam. No joint effusion is seen. IMPRESSION: Postsurgical changes stable in appearance from the prior exam. No  acute abnormality noted. Electronically Signed   By: Alcide Clever M.D.   On: 01/07/2022 00:08    Procedures Procedures    Medications Ordered in ED Medications  ketorolac (TORADOL) injection 60 mg (60 mg Intramuscular Given 01/07/22 0059)    ED Course/ Medical Decision Making/ A&P                           Medical Decision Making Fall onto left elbow 3 hours ago.  No medications taken   Problems Addressed: Pain of left forearm: acute illness or injury    Details: xrays  Amount and/or Complexity of Data Reviewed Independent Historian: parent    Details: see above Radiology: ordered.    Details: personally reviewed no acute fracture of elbow or forearm,  Risk Prescription drug management. Risk Details: Patient with fall onto left elbow.  FROM.  Biceps and triceps tendons intact.  LUE NVI to confrontation.  Previous surgery is intact.  Ice, elevation and NSAIDS.      Final Clinical Impression(s) / ED Diagnoses Final diagnoses:  Pain of left forearm   Return for intractable cough, coughing up blood, fevers > 100.4 unrelieved by medication, shortness of breath, intractable vomiting, chest pain, shortness of breath, weakness, numbness, changes in speech, facial asymmetry, abdominal pain, passing out, Inability to tolerate liquids or food, cough, altered mental status or any concerns. No signs of systemic illness or infection. The patient is nontoxic-appearing on exam and vital signs are within normal limits.   I have reviewed the triage vital signs and the nursing notes. Pertinent labs & imaging results that were available during my care of the patient were reviewed by me and considered in my medical decision making (see chart for details). After history, exam, and medical workup I feel the patient has been appropriately medically screened and is safe for discharge home. Pertinent diagnoses were discussed with the patient. Patient was given return precautions.   Rx / DC Orders ED Discharge Orders          Ordered    naproxen (NAPROSYN) 375 MG tablet  2 times daily with meals        01/07/22 0120              Debborah Alonge, MD 01/07/22 0124

## 2022-02-13 ENCOUNTER — Ambulatory Visit: Payer: BLUE CROSS/BLUE SHIELD | Admitting: Physician Assistant

## 2022-02-13 ENCOUNTER — Ambulatory Visit (INDEPENDENT_AMBULATORY_CARE_PROVIDER_SITE_OTHER): Payer: BLUE CROSS/BLUE SHIELD

## 2022-02-13 ENCOUNTER — Other Ambulatory Visit: Payer: Self-pay

## 2022-02-13 ENCOUNTER — Encounter: Payer: Self-pay | Admitting: Physician Assistant

## 2022-02-13 DIAGNOSIS — M79602 Pain in left arm: Secondary | ICD-10-CM | POA: Diagnosis not present

## 2022-02-13 NOTE — Progress Notes (Signed)
HPI: Karla Nichols comes in today status post fall in her kitchen on February 17.  She continues to have forearm pain.  She was initially seen at [facility] ER on 01/06/2022 where radiographs were obtained.  That showed no acute fracture.  Films are reviewed.  There is nonunion of a previous radial head fracture but no hardware failure.  She continues to have pain proximal ulna and distal radius and ulna.  She has pain with motion of the arm.  She is tried naproxen and Tylenol as needed.  She states she has stiffness first thing in the morning weakness in the arm. ? ?ROS:See HPI ? ?Physical exam: General well-developed well-nourished female no acute distress. ?Left forearm no rashes skin lesions ulcerations or ecchymosis.  She has tenderness over the proximal ulnar at the distal end of the plate.  Well-healed surgical incisions about the forearm.  No tenderness distal forearm.  Full supination pronation.  Full extension flexion of the elbow. ? ?Radiographs:Left forearm 2 views: Status post plating both bone with retained hardware.  Slight lucency around the most proximal ulnar screw.  There is no backing out of the screw.  No other hardware failure seen.  No acute fractures or other bony abnormalities. Chronic non union radial neck.  ? ? ?Impression :Left forearm pain ? ?Plan: We will have her avoid any heavy lifting, high impact activities, push-ups or pull-ups.  See her back in 4 weeks see how she is doing overall.  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  Questions were encouraged and answered at length. ? ? ? ?

## 2022-03-13 ENCOUNTER — Encounter: Payer: Self-pay | Admitting: Physician Assistant

## 2022-03-13 ENCOUNTER — Ambulatory Visit: Payer: Self-pay

## 2022-03-13 ENCOUNTER — Ambulatory Visit: Payer: BLUE CROSS/BLUE SHIELD | Admitting: Physician Assistant

## 2022-03-13 DIAGNOSIS — M79602 Pain in left arm: Secondary | ICD-10-CM | POA: Diagnosis not present

## 2022-03-13 NOTE — Addendum Note (Signed)
Addended by: Barbette Or on: 03/13/2022 04:29 PM ? ? Modules accepted: Orders ? ?

## 2022-03-13 NOTE — Progress Notes (Signed)
HPI: Karla Nichols returns today in follow-up for left forearm.  She states the lump over her elbow is getting larger.  Still has pain with certain motions.  She does feel like she is slightly improved from last visit.  She still states she is unable to perform activities that she was able to perform prior to her fall on February 17.  She still taking ibuprofen and Tylenol for pain. ? ?Review of systems see HPI: ? ?Physical exam: Right elbow she has full extension full flexion.  Full supination pronation of the forearm.  No rashes skin lesions ulcerations impending ulcers or ecchymosis about the forearm.  There is prominence over the ulnar region but no significantly prominent hardware.  She has no real tenderness with palpation about the proximal radius and ulna.  Surgical incisions are well-healed. ? ? ?Radiographs: 2 views left forearm: Given lucency seen around the most proximal ulnar screw may be a large area of lucency than prior films 1 month ago.  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 ? ?Impression: Left proximal radius ulna pain ? ? ?Plan: We will send her for CT scan of her left elbow and forearm to evaluate for hardware failure and also evaluate the nonunion of the radial neck.  Questions were encouraged and answered.  She will follow-up after the scan to go over results and discuss further treatment. ?

## 2022-04-20 ENCOUNTER — Telehealth: Payer: Self-pay | Admitting: Physician Assistant

## 2022-04-20 NOTE — Telephone Encounter (Signed)
Called pt 1X and left a vm for pt to call and set a CT Scan review appt with PA Clark after 05/09/22

## 2022-05-11 ENCOUNTER — Encounter: Payer: Self-pay | Admitting: Physician Assistant

## 2022-05-11 ENCOUNTER — Ambulatory Visit (INDEPENDENT_AMBULATORY_CARE_PROVIDER_SITE_OTHER): Payer: BLUE CROSS/BLUE SHIELD | Admitting: Physician Assistant

## 2022-05-11 DIAGNOSIS — M79602 Pain in left arm: Secondary | ICD-10-CM

## 2022-05-30 ENCOUNTER — Ambulatory Visit: Payer: BLUE CROSS/BLUE SHIELD | Admitting: Orthopedic Surgery

## 2022-06-15 ENCOUNTER — Ambulatory Visit: Payer: BLUE CROSS/BLUE SHIELD | Admitting: Orthopedic Surgery

## 2022-06-15 ENCOUNTER — Ambulatory Visit (INDEPENDENT_AMBULATORY_CARE_PROVIDER_SITE_OTHER): Payer: BLUE CROSS/BLUE SHIELD

## 2022-06-15 DIAGNOSIS — S52202G Unspecified fracture of shaft of left ulna, subsequent encounter for closed fracture with delayed healing: Secondary | ICD-10-CM | POA: Insufficient documentation

## 2022-06-15 DIAGNOSIS — S52135K Nondisplaced fracture of neck of left radius, subsequent encounter for closed fracture with nonunion: Secondary | ICD-10-CM | POA: Diagnosis not present

## 2022-06-15 DIAGNOSIS — S52092A Other fracture of upper end of left ulna, initial encounter for closed fracture: Secondary | ICD-10-CM

## 2022-06-22 DIAGNOSIS — S52133A Displaced fracture of neck of unspecified radius, initial encounter for closed fracture: Secondary | ICD-10-CM | POA: Insufficient documentation

## 2022-06-22 DIAGNOSIS — S52002A Unspecified fracture of upper end of left ulna, initial encounter for closed fracture: Secondary | ICD-10-CM | POA: Insufficient documentation

## 2022-06-22 NOTE — Progress Notes (Signed)
Office Visit Note   Patient: Karla Nichols           Date of Birth: June 04, 1986           MRN: [id number] Visit Date: 06/15/2022              Requested by: Farris Has, MD [address] [place],  [place] [address] PCP: Farris Has, MD   Assessment & Plan: Visit Diagnoses:  1. Unspecified fracture of shaft of left ulna, subsequent encounter for closed fracture with delayed healing   2. Closed nondisplaced fracture of neck of left radius with nonunion, subsequent encounter   3. Other closed fracture of proximal end of left ulna, initial encounter     Plan: We reviewed patient's recent x-ray which shows a peri-implant fracture of the proximal ulna at the most proximal screw.  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  The radial neck nonunion seems to be asymptomatic for her as she has full pronation and supination without any pain.  She never had any pain in the elbow prior to her fall in February.  We discussed treatment of her proximal ulna fracture would involve a large incision, removal of her previous plates, and spanning the fracture with a long plate.  She may require bone grafting of the ulna fracture site.  Given that the radial neck fracture is asymptomatic, we will try to treat this conservatively.  She may need a radial head arthroplasty in the future if this becomes symptomatic for her or is symptomatic following union of the ulna fracture.  We reviewed the risks of surgery including bleeding, infection, damage to neurovascular structures, nonunion, malunion, incomplete symptom relief.  Patient like to proceed with surgery.  Date and time will be confirmed with the patient.  Follow-Up Instructions: No follow-ups on file.   Orders:  Orders Placed This Encounter  Procedures   XR Forearm Left   No orders of the defined types were placed in this encounter.     Procedures: No procedures performed   Clinical Data: No additional  findings.   Subjective: Chief Complaint  Patient presents with   Left Elbow - Pain    This is a 36 year old right-hand-dominant female who presents with pain at the left proximal forearm.  She had a both bone forearm fracture in 2017 which was fixed with a plate and screw construct.  She fell again and had a different ulnar shaft fracture in 2018 which was also fixed with internal fixation.  She had some occasional soreness in the forearm but this acutely worsened after a ground-level fall in February.  She lost her balance in the kitchen and landed on her outstretched left arm.  She continues to complain of pain in the proximal forearm.  A recent CT demonstrated a fracture at the proximal most screw.  She has a bony prominence of the proximal ulna which is tender to palpation and painful for her.  She has difficulty with lifting objects or holding objects.  She notes that her arm is much weaker than it was previously.  She has no pain with forearm rotation.    Review of Systems   Objective: Vital Signs: There were no vitals taken for this visit.  Physical Exam Constitutional:      Appearance: Normal appearance.  Cardiovascular:     Rate and Rhythm: Normal rate.     Pulses: Normal pulses.  Pulmonary:     Effort: Pulmonary effort  is normal.  Skin:    General: Skin is warm and dry.     Capillary Refill: Capillary refill takes less than 2 seconds.  Neurological:     Mental Status: She is alert.     Left Hand Exam   Tenderness  Left hand tenderness location: TTP at the proximal ulna with bony prominence.   Other  Erythema: absent Sensation: normal Pulse: present  Comments:  She has a healed posterior incision over the ulna.  She has full pronation and supination of the forearm with palpable crepitus but without pain.  Pain with terminal extension and flexion.       Specialty Comments:  No specialty comments available.  Imaging: No results found.   PMFS  History: Patient Active Problem List   Diagnosis Date Noted   Fracture of radial neck, closed 06/22/2022   Fracture of left proximal ulna 06/22/2022   Unspecified fracture of shaft of left ulna, subsequent encounter for closed fracture with delayed healing 06/15/2022   Radial head fracture, closed 06/11/2017    Class: Acute   Radius/ulna fracture, left, open type I or II, initial encounter 06/08/2017    Class: Acute   Forearm fractures, both bones, closed 06/08/2016   Abnormality of gait 01/23/2014   Past Medical History:  Diagnosis Date   Abnormality of gait    Gait abnormality    Migraine without aura, without mention of intractable migraine without mention of status migrainosus     Family History  Problem Relation Age of Onset   Hypertension Mother    Diabetes Mother     Past Surgical History:  Procedure Laterality Date   NO PAST SURGERIES     none     ORIF ULNAR FRACTURE Left 06/13/2016   Procedure: OPEN REDUCTION INTERNAL FIXATION (ORIF) LEFT RADIUS AND ULNA FRACTURES;  Surgeon: Kathryne Hitch, MD;  Location: [facility] OR;  Service: Orthopedics;  Laterality: Left;   ORIF ULNAR FRACTURE Left 06/09/2017   Procedure: OPEN REDUCTION INTERNAL FIXATION (ORIF) ULNAR FRACTURE INCISION AND DRAINAGE EXAM UNDER ANESTHESIA RADIAL HEAD;  Surgeon: Kerrin Champagne, MD;  Location: [facility] ORS;  Service: Orthopedics;  Laterality: Left;   Social History   Occupational History   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

## 2022-06-30 IMAGING — DX DG ELBOW COMPLETE 3+V*L*
4 series · 4 of 4 positions shown · non-contrast
Comparison: September 04, 2017

CLINICAL DATA: Status post fall.

EXAM:
LEFT ELBOW - COMPLETE 3+ VIEW

[elbow ap]
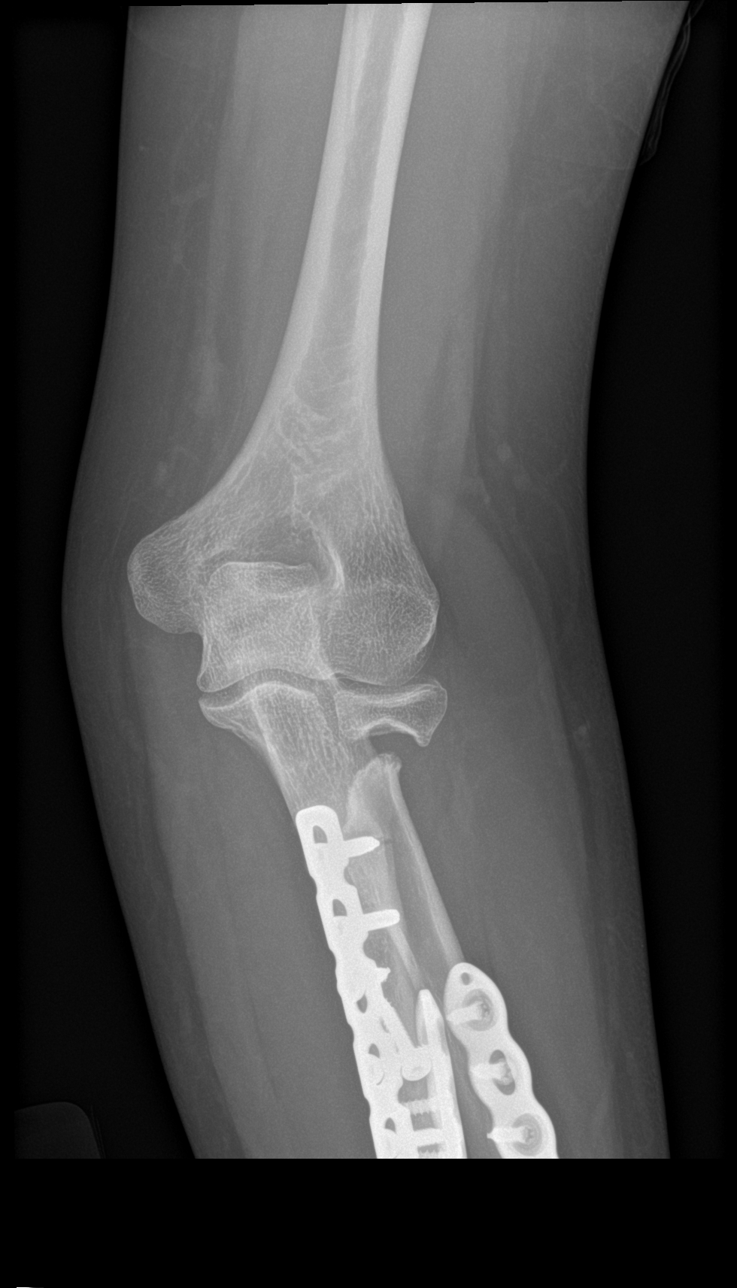

[elbow obl (1 of 2)]
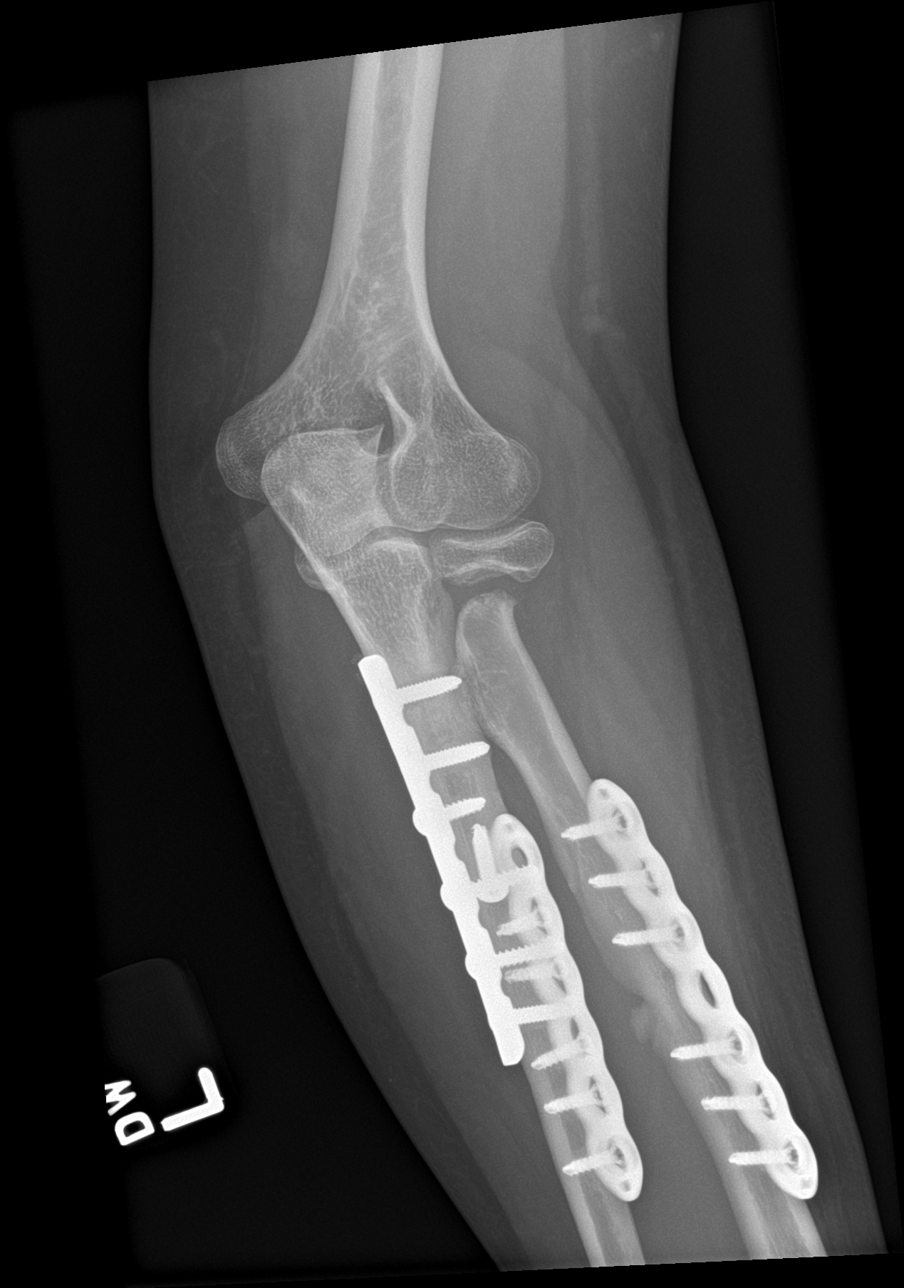

[elbow obl (2 of 2)]
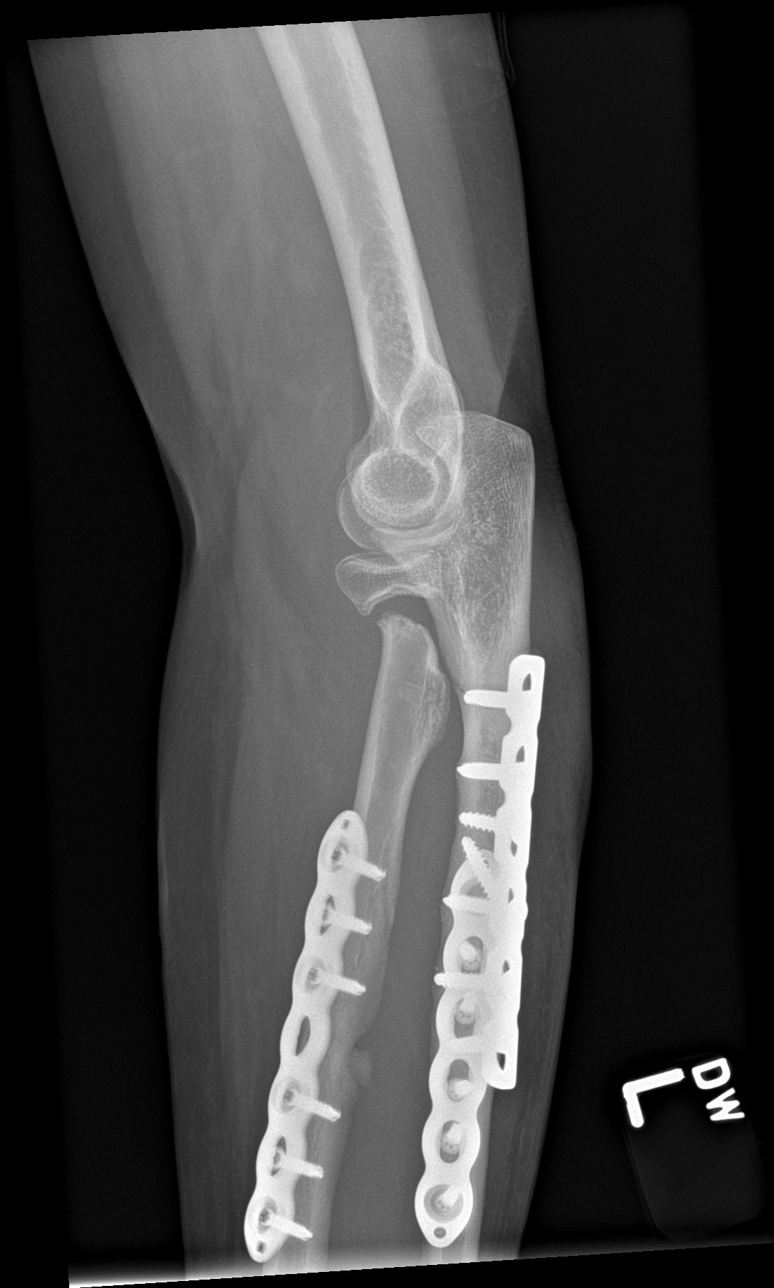

[elbow lat]
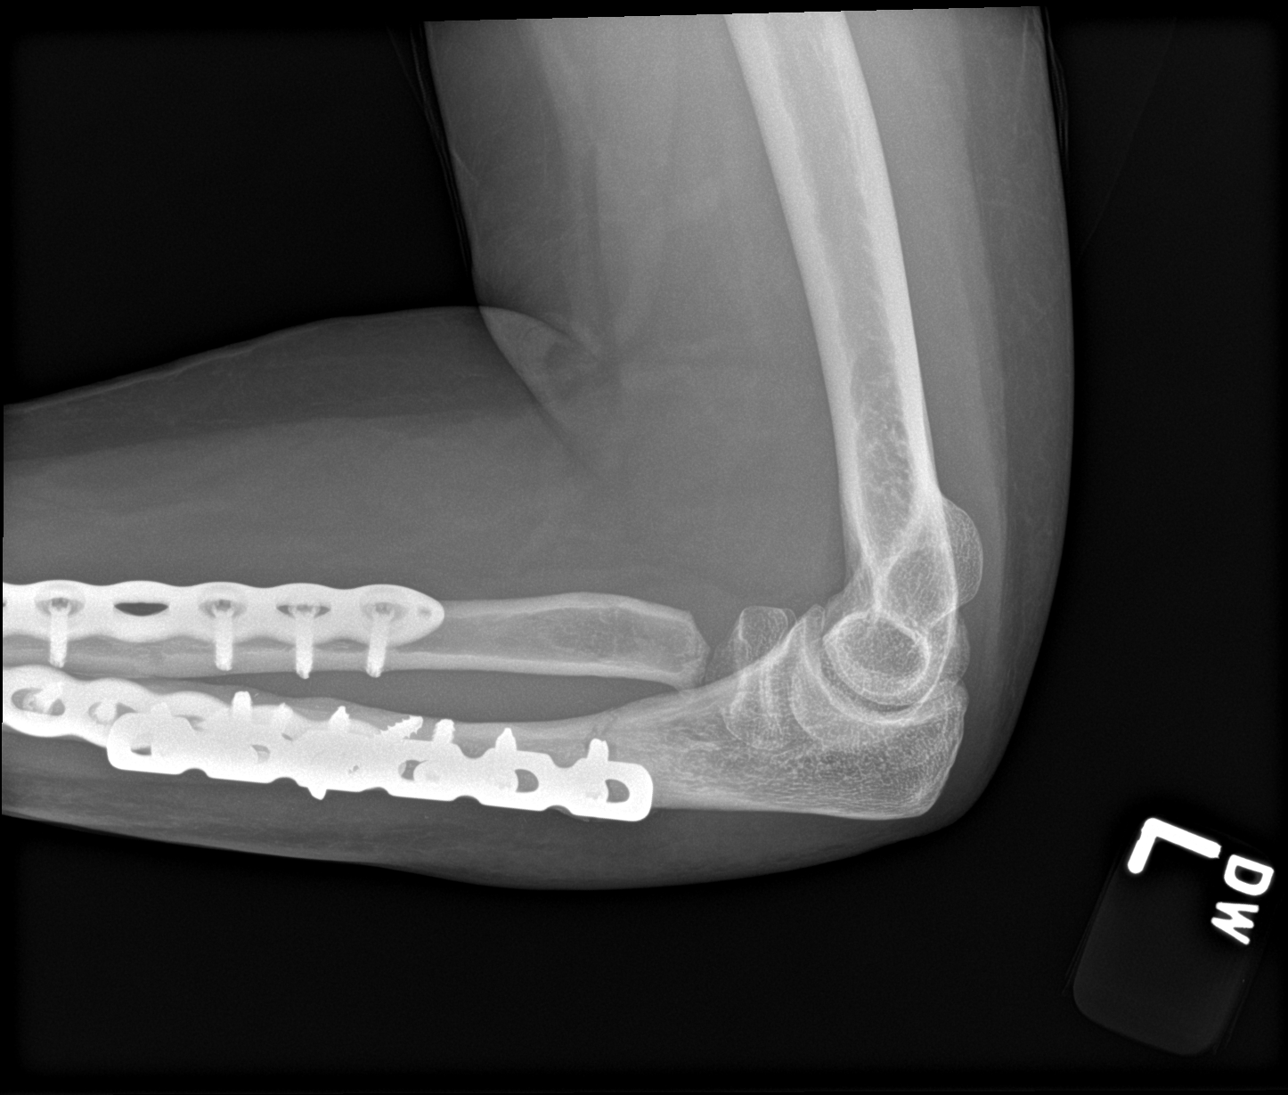

[4 of 4 positions shown; findings below may reference images not displayed]

FINDINGS: There is no evidence of an acute fracture, dislocation, or joint
effusion. Radiopaque fixation plates and screws are seen along the
proximal to mid shafts of the left radius and left ulna. A chronic
deformity of the neck of the proximal left radius is also seen.
There is no evidence of arthropathy or other focal bone abnormality.
Soft tissues are unremarkable.
IMPRESSION: 1. No acute fracture or dislocation.
2. Prior open reduction and internal fixation of the left radius and
left ulna.

## 2022-07-05 ENCOUNTER — Telehealth: Payer: Self-pay | Admitting: Orthopedic Surgery

## 2022-07-05 NOTE — Telephone Encounter (Signed)
Surgery sheet is done

## 2022-07-05 NOTE — Telephone Encounter (Signed)
Patient left a message stating she has been waiting on a call to schedule elbow surgery since her visit on 06/15/22. Please advise. I do not have a surgery sheet.

## 2022-07-06 NOTE — Telephone Encounter (Signed)
I spoke with patient in regards to scheduling surgery on Friday. Also, we are not in network with patient's insurance, and patient does not have benefits with [place]. Patient has chosen to hold off on surgery. Patient was more concerned about scheduling so soon. Advised patient to call back if she decides to proceed.

## 2023-01-25 ENCOUNTER — Encounter: Payer: Self-pay | Admitting: Radiology

## 2023-03-09 ENCOUNTER — Other Ambulatory Visit: Payer: Self-pay | Admitting: Physician Assistant

## 2023-03-09 DIAGNOSIS — M79602 Pain in left arm: Secondary | ICD-10-CM

## 2023-07-09 DIAGNOSIS — M25522 Pain in left elbow: Secondary | ICD-10-CM | POA: Diagnosis not present

## 2023-08-15 DIAGNOSIS — S52132K Displaced fracture of neck of left radius, subsequent encounter for closed fracture with nonunion: Secondary | ICD-10-CM | POA: Diagnosis not present

## 2023-08-15 DIAGNOSIS — S52202K Unspecified fracture of shaft of left ulna, subsequent encounter for closed fracture with nonunion: Secondary | ICD-10-CM | POA: Diagnosis not present

## 2023-08-15 DIAGNOSIS — T84498A Other mechanical complication of other internal orthopedic devices, implants and grafts, initial encounter: Secondary | ICD-10-CM | POA: Diagnosis not present

## 2023-10-04 NOTE — Progress Notes (Signed)
Surgical Instructions   Your procedure is scheduled on Tuesday, November 19th, 2024. Report to [facility] Main Entrance "A" at 6:00 A.M., then check in with the Admitting office. Any questions or running late day of surgery: call [phone]  Questions prior to your surgery date: call [phone], Monday-Friday, 8am-4pm. If you experience any cold or flu symptoms such as cough, fever, chills, shortness of breath, etc. between now and your scheduled surgery, please notify us at the above number.     Remember:  Do not eat after midnight the night before your surgery   You may drink clear liquids until 5:00 the morning of your surgery.   Clear liquids allowed are: Water, Non-Citrus Juices (without pulp), Carbonated Beverages, Clear Tea (no milk, honey, etc.), Black Coffee Only (NO MILK, CREAM OR POWDERED CREAMER of any kind), and Gatorade.    Take these medicines the morning of surgery with A SIP OF WATER:   Cetirizine (Zyrtec)   May take these medicines IF NEEDED: None.   One week prior to surgery, STOP taking any Aspirin (unless otherwise instructed by your surgeon) Aleve, Naproxen, Ibuprofen, Motrin, Advil, Goody's, BC's, all herbal medications, fish oil, and non-prescription vitamins.                     Do NOT Smoke (Tobacco/Vaping) for 24 hours prior to your procedure.  If you use a CPAP at night, you may bring your mask/headgear for your overnight stay.   You will be asked to remove any contacts, glasses, piercing's, hearing aid's, dentures/partials prior to surgery. Please bring cases for these items if needed.    Patients discharged the day of surgery will not be allowed to drive home, and someone needs to stay with them for 24 hours.  SURGICAL WAITING ROOM VISITATION Patients may have no more than 2 support people in the waiting area - these visitors may rotate.   Pre-op nurse will coordinate an appropriate time for 1 ADULT support person, who may not rotate, to  accompany patient in pre-op.  Children under the age of 62 must have an adult with them who is not the patient and must remain in the main waiting area with an adult.  If the patient needs to stay at the hospital during part of their recovery, the visitor guidelines for inpatient rooms apply.  Please refer to the [facility] website for the visitor guidelines for any additional information.   If you received a COVID test during your pre-op visit  it is requested that you wear a mask when out in public, stay away from anyone that may not be feeling well and notify your surgeon if you develop symptoms. If you have been in contact with anyone that has tested positive in the last 10 days please notify you surgeon.      Pre-operative CHG Bathing Instructions   You can play a key role in reducing the risk of infection after surgery. Your skin needs to be as free of germs as possible. You can reduce the number of germs on your skin by washing with CHG (chlorhexidine gluconate) soap before surgery. CHG is an antiseptic soap that kills germs and continues to kill germs even after washing.   DO NOT use if you have an allergy to chlorhexidine/CHG or antibacterial soaps. If your skin becomes reddened or irritated, stop using the CHG and notify one of our RNs at [phone].  TAKE A SHOWER THE NIGHT BEFORE SURGERY AND THE DAY OF SURGERY    Please keep in mind the following:  DO NOT shave, including legs and underarms, 48 hours prior to surgery.   You may shave your face before/day of surgery.  Place clean sheets on your bed the night before surgery Use a clean washcloth (not used since being washed) for each shower. DO NOT sleep with pet's night before surgery.  CHG Shower Instructions:  Wash your face and private area with normal soap. If you choose to wash your hair, wash first with your normal shampoo.  After you use shampoo/soap, rinse your hair and body thoroughly to remove  shampoo/soap residue.  Turn the water OFF and apply half the bottle of CHG soap to a CLEAN washcloth.  Apply CHG soap ONLY FROM YOUR NECK DOWN TO YOUR TOES (washing for 3-5 minutes)  DO NOT use CHG soap on face, private areas, open wounds, or sores.  Pay special attention to the area where your surgery is being performed.  If you are having back surgery, having someone wash your back for you may be helpful. Wait 2 minutes after CHG soap is applied, then you may rinse off the CHG soap.  Pat dry with a clean towel  Put on clean pajamas    Additional instructions for the day of surgery: DO NOT APPLY any lotions, deodorants, cologne, or perfumes.   Do not wear jewelry or makeup Do not wear nail polish, gel polish, artificial nails, or any other type of covering on natural nails (fingers and toes) Do not bring valuables to the hospital. [facility] is not responsible for valuables/personal belongings. Put on clean/comfortable clothes.  Please brush your teeth.  Ask your nurse before applying any prescription medications to the skin.

## 2023-10-05 ENCOUNTER — Other Ambulatory Visit: Payer: Self-pay

## 2023-10-05 ENCOUNTER — Encounter (HOSPITAL_COMMUNITY)
Admission: RE | Admit: 2023-10-05 | Discharge: 2023-10-05 | Disposition: A | Discharge status: Home / Self Care | Payer: 59 | Source: Ambulatory Visit | Attending: Orthopedic Surgery | Admitting: Orthopedic Surgery

## 2023-10-05 ENCOUNTER — Encounter (HOSPITAL_COMMUNITY): Payer: Self-pay

## 2023-10-05 VITALS — BP 132/89 | HR 87 | Temp 98.4°F | Resp 18 | Ht 63.0 in | Wt 149.4 lb

## 2023-10-05 DIAGNOSIS — Z01812 Encounter for preprocedural laboratory examination: Secondary | ICD-10-CM | POA: Diagnosis not present

## 2023-10-05 DIAGNOSIS — Z01818 Encounter for other preprocedural examination: Secondary | ICD-10-CM

## 2023-10-05 LAB — BASIC METABOLIC PANEL
Anion gap: 9 (ref 5–15)
BUN: 20 mg/dL (ref 6–20)
CO2: 24 mmol/L (ref 22–32)
Calcium: 9.4 mg/dL (ref 8.9–10.3)
Chloride: 108 mmol/L (ref 98–111)
Creatinine, Ser: 0.69 mg/dL (ref 0.44–1.00)
GFR, Estimated: >60 mL/min (ref >60)
Glucose, Bld: 99 mg/dL (ref 70–99)
Potassium: 4.3 mmol/L (ref 3.5–5.1)
Sodium: 141 mmol/L (ref 135–145)

## 2023-10-05 LAB — CBC
HCT: 37.9 % (ref 36.0–46.0)
Hemoglobin: 12.2 g/dL (ref 12.0–15.0)
MCH: 26.9 pg (ref 26.0–34.0)
MCHC: 32.2 g/dL (ref 30.0–36.0)
MCV: 83.5 fL (ref 80.0–100.0)
Platelets: 266 10*3/uL (ref 150–400)
RBC: 4.54 MIL/uL (ref 3.87–5.11)
RDW: 14.7 % (ref 11.5–15.5)
WBC: 5.3 10*3/uL (ref 4.0–10.5)
nRBC: 0 % (ref 0.0–0.2)

## 2023-10-05 NOTE — Progress Notes (Signed)
PCP - Dr. Farris Has Cardiologist - does not see one; no history  PPM/ICD - N/A Device Orders - N/A  Rep Notified - N/A  Chest x-ray - N/A EKG - DENIES Stress Test - DENIES ECHO - DENIES Cardiac Cath - DENIES  Sleep Study - DENIES CPAP - DENIES  Fasting Blood Sugar - DENIES  Checks Blood Sugar _____ times a day N/A  Last dose of GLP1 agonist-  N/A GLP1 instructions: N/A  Blood Thinner Instructions:  Aspirin Instructions: Follow provider instructions  ERAS Protcol - Y PRE-SURGERY Ensure or G2- NONE  COVID TEST- N/A   Anesthesia review: N/A  Patient denies shortness of breath, fever, cough and chest pain at PAT appointment. Patient denies any respiratory illnesses or issues. Patient denies being around anyone with any respiratory issues.   All instructions explained to the patient, with a verbal understanding of the material. Patient agrees to go over the instructions while at home for a better understanding. Patient also instructed to self quarantine after being tested for COVID-19. The opportunity to ask questions was provided.

## 2023-10-08 NOTE — H&P (Signed)
Orthopaedic Trauma Service (OTS) Consult   Patient ID: Karla Nichols MRN: [id number] DOB/AGE: 1985-12-23 37 y.o.    HPI: Karla Nichols is an 37 y.o. 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  She has not been on any medications for this.  Resultant imbalance and unsteady gait along with frequent falls as well as crushes of the walls or objects while ambulating.  Issues with her left wrist began in 1999 with left wrist fracture 2007 she had a both bone forearm fracture that is treated with ORIF by Dr. Magnus Ivan.  2018 she had an open fracture treated with an additional plate by Dr. Otelia Sergeant.  She has had long standing radial neck nonunion at least since that time she denies any pain with forearm rotation but she has some discomfort in the forearm particularly the elbow area for the last year.  She notes clicking in her DRUJ since 1999.  Denies having any wound drainage or difficulty with healing.  Denies any numbness or tingling in the arm currently works as a Conservation officer, nature at [employer] and lives at home with her father after the passing of her mother in the past year and receives additional help and support from her church she does not smoke occasionally drinks alcohol  Past Medical History:  Diagnosis Date   Abnormality of gait    Gait abnormality    Migraine without aura, without mention of intractable migraine without mention of status migrainosus    patient states not sure where this came from; does not have    Past Surgical History:  Procedure Laterality Date   none     ORIF ULNAR FRACTURE Left 06/13/2016   Procedure: OPEN REDUCTION INTERNAL FIXATION (ORIF) LEFT RADIUS AND ULNA FRACTURES;  Surgeon: Kathryne Hitch, MD;  Location: [facility] OR;  Service: Orthopedics;  Laterality: Left;   ORIF ULNAR FRACTURE Left 06/09/2017   Procedure: OPEN REDUCTION INTERNAL FIXATION (ORIF) ULNAR FRACTURE INCISION AND DRAINAGE EXAM UNDER ANESTHESIA RADIAL HEAD;  Surgeon: Kerrin Champagne, MD;   Location: [facility] ORS;  Service: Orthopedics;  Laterality: Left;    Family History  Problem Relation Age of Onset   Hypertension Mother    Diabetes Mother     Social History:  reports that she has never smoked. She has never used smokeless tobacco. She reports that she does not drink alcohol and does not use drugs.  Allergies: No Known Allergies  Medications: Current Meds  Medication Sig   acidophilus (RISAQUAD) CAPS capsule Take 1 capsule by mouth daily.   Calcium Carb-Cholecalciferol (CALTRATE 600+D3 PO) Take 1 tablet by mouth daily.   cetirizine (ZYRTEC) 10 MG tablet Take 10 mg by mouth daily.   naproxen sodium (ALEVE) 220 MG tablet Take 220-440 mg by mouth 3 (three) times daily as needed (pain).     No results found for this or any previous visit (from the past 48 hour(s)).  No results found.  Intake/Output    None      ROS As noted above Last menstrual period 09/25/2023. Physical Exam  Patient is a 5 foot 3-1/2 inch 130 pound female.  Pleasant and appropriate for stated age.  She communicates well and clearly  Left upper extremity No redness or open wounds of the left arm or elbow. She has tenderness and bossing in the area of the distal olecranon and proximal ulna Some mild clicking of the DRUJ is noted but no pain.  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  Reviewed with the patient and she wished to proceed in a staged fashion with removal of implants, partial excision of at least the ulna and we could consider leave the radial hardware.  We would anticipate placement of antibiotic spacer with  return to the OR in 4 to 6 weeks for grafting.  We would not recommend treating the radial neck as her function could not be made better with regard to that.  We will obtain intraoperative cultures.  Antibiotic should be held preoperatively.  Inflammatory markers have been ordered as well.  We may consider ID consult.  She is well aware of potential complications given her ataxia with subsequent falls and possible refracture through her nonunion site causing further disability.  Patient is in agreement plan she wishes to proceed  [name], PA-C [phone] (C) 10/08/2023, 4:14 PM  [facility] [address] [place] [address] [phone] Collier Bullock (F)    After 5pm and on the weekends please log on to Amion, go to orthopaedics and the look under the [facility] Call for the provider(s) on call. You can also call our office at [phone] and then follow the prompts to be connected to the call team.

## 2023-10-08 NOTE — Anesthesia Preprocedure Evaluation (Signed)
Anesthesia Evaluation  Patient identified by MRN, date of birth, ID band Patient awake    Reviewed: Allergy & Precautions, NPO status , Patient's Chart, lab work & pertinent test results  History of Anesthesia Complications Negative for: history of anesthetic complications  Airway Mallampati: II  TM Distance: >3 FB Neck ROM: Full    Dental   Pulmonary neg pulmonary ROS   Pulmonary exam normal breath sounds clear to auscultation       Cardiovascular negative cardio ROS  Rhythm:Regular Rate:Normal     Neuro/Psych neg Seizures Cerebellar ataxia    GI/Hepatic negative GI ROS, Neg liver ROS,,,  Endo/Other  negative endocrine ROS    Renal/GU negative Renal ROS     Musculoskeletal   Abdominal   Peds  Hematology negative hematology ROS (+) Lab Results      Component                Value               Date                      WBC                      5.3                 10/05/2023                HGB                      12.2                10/05/2023                HCT                      37.9                10/05/2023                MCV                      83.5                10/05/2023                PLT                      266                 10/05/2023              Anesthesia Other Findings   Reproductive/Obstetrics                             Anesthesia Physical Anesthesia Plan  ASA: 2  Anesthesia Plan: General   Post-op Pain Management: Tylenol PO (pre-op)*   Induction: Intravenous  PONV Risk Score and Plan: 3 and Ondansetron, Dexamethasone and Treatment may vary due to age or medical condition  Airway Management Planned: LMA  Additional Equipment:   Intra-op Plan:   Post-operative Plan: Extubation in OR  Informed Consent: I have reviewed the patients History and Physical, chart, labs and discussed the procedure including the risks, benefits and alternatives for the  proposed anesthesia with the patient or authorized representative who has indicated  his/her understanding and acceptance.     Dental advisory given  Plan Discussed with: CRNA and Anesthesiologist  Anesthesia Plan Comments: (Discussed potential risks of nerve blocks including, but not limited to, infection, bleeding, nerve damage, seizures, pneumothorax, respiratory depression, and potential failure of the block. Alternatives to nerve blocks discussed. All questions answered.  Risks of general anesthesia discussed including, but not limited to, sore throat, hoarse voice, chipped/damaged teeth, injury to vocal cords, nausea and vomiting, allergic reactions, lung infection, heart attack, stroke, and death. All questions answered. )       Anesthesia Quick Evaluation

## 2023-10-09 ENCOUNTER — Ambulatory Visit (HOSPITAL_BASED_OUTPATIENT_CLINIC_OR_DEPARTMENT_OTHER): Payer: Self-pay | Admitting: Anesthesiology

## 2023-10-09 ENCOUNTER — Other Ambulatory Visit: Payer: Self-pay

## 2023-10-09 ENCOUNTER — Ambulatory Visit (HOSPITAL_COMMUNITY): Payer: 59

## 2023-10-09 ENCOUNTER — Encounter (HOSPITAL_COMMUNITY)
Admission: RE | Disposition: A | Discharge status: Home / Self Care | Payer: Self-pay | Source: Home / Self Care | Attending: Orthopedic Surgery

## 2023-10-09 ENCOUNTER — Encounter (HOSPITAL_COMMUNITY): Payer: Self-pay | Admitting: Orthopedic Surgery

## 2023-10-09 ENCOUNTER — Ambulatory Visit (HOSPITAL_COMMUNITY)
Admission: RE | Admit: 2023-10-09 | Discharge: 2023-10-12 | Disposition: A | Discharge status: Home / Self Care | Payer: 59 | Attending: Orthopedic Surgery | Admitting: Orthopedic Surgery

## 2023-10-09 ENCOUNTER — Ambulatory Visit (HOSPITAL_COMMUNITY): Payer: Self-pay | Admitting: Anesthesiology

## 2023-10-09 DIAGNOSIS — E559 Vitamin D deficiency, unspecified: Secondary | ICD-10-CM | POA: Insufficient documentation

## 2023-10-09 DIAGNOSIS — G119 Hereditary ataxia, unspecified: Secondary | ICD-10-CM | POA: Diagnosis not present

## 2023-10-09 DIAGNOSIS — W19XXXD Unspecified fall, subsequent encounter: Secondary | ICD-10-CM | POA: Insufficient documentation

## 2023-10-09 DIAGNOSIS — S52092A Other fracture of upper end of left ulna, initial encounter for closed fracture: Secondary | ICD-10-CM

## 2023-10-09 DIAGNOSIS — T84123A Displacement of internal fixation device of bone of left forearm, initial encounter: Secondary | ICD-10-CM | POA: Insufficient documentation

## 2023-10-09 DIAGNOSIS — Z23 Encounter for immunization: Secondary | ICD-10-CM | POA: Diagnosis not present

## 2023-10-09 DIAGNOSIS — S52132K Displaced fracture of neck of left radius, subsequent encounter for closed fracture with nonunion: Secondary | ICD-10-CM | POA: Diagnosis not present

## 2023-10-09 DIAGNOSIS — Z472 Encounter for removal of internal fixation device: Secondary | ICD-10-CM | POA: Diagnosis not present

## 2023-10-09 DIAGNOSIS — S52202K Unspecified fracture of shaft of left ulna, subsequent encounter for closed fracture with nonunion: Secondary | ICD-10-CM | POA: Diagnosis present

## 2023-10-09 DIAGNOSIS — R296 Repeated falls: Secondary | ICD-10-CM | POA: Diagnosis not present

## 2023-10-09 DIAGNOSIS — T84038A Mechanical loosening of other internal prosthetic joint, initial encounter: Secondary | ICD-10-CM | POA: Diagnosis not present

## 2023-10-09 DIAGNOSIS — S52102K Unspecified fracture of upper end of left radius, subsequent encounter for closed fracture with nonunion: Secondary | ICD-10-CM | POA: Diagnosis not present

## 2023-10-09 DIAGNOSIS — S42202D Unspecified fracture of upper end of left humerus, subsequent encounter for fracture with routine healing: Secondary | ICD-10-CM | POA: Diagnosis not present

## 2023-10-09 DIAGNOSIS — R609 Edema, unspecified: Secondary | ICD-10-CM | POA: Diagnosis not present

## 2023-10-09 DIAGNOSIS — S52002K Unspecified fracture of upper end of left ulna, subsequent encounter for closed fracture with nonunion: Secondary | ICD-10-CM | POA: Insufficient documentation

## 2023-10-09 DIAGNOSIS — R269 Unspecified abnormalities of gait and mobility: Secondary | ICD-10-CM

## 2023-10-09 DIAGNOSIS — Z9889 Other specified postprocedural states: Secondary | ICD-10-CM | POA: Diagnosis not present

## 2023-10-09 HISTORY — PX: HARDWARE REMOVAL: SHX979

## 2023-10-09 HISTORY — PX: ORIF ULNAR FRACTURE: SHX5417

## 2023-10-09 LAB — COMPREHENSIVE METABOLIC PANEL
ALT: 16 U/L (ref 0–44)
AST: 23 U/L (ref 15–41)
Albumin: 3.5 g/dL (ref 3.5–5.0)
Alkaline Phosphatase: 43 U/L (ref 38–126)
Anion gap: 8 (ref 5–15)
BUN: 11 mg/dL (ref 6–20)
CO2: 23 mmol/L (ref 22–32)
Calcium: 8.3 mg/dL — ABNORMAL LOW (ref 8.9–10.3)
Chloride: 106 mmol/L (ref 98–111)
Creatinine, Ser: 0.76 mg/dL (ref 0.44–1.00)
GFR, Estimated: >60 mL/min (ref >60)
Glucose, Bld: 180 mg/dL — ABNORMAL HIGH (ref 70–99)
Potassium: 4.2 mmol/L (ref 3.5–5.1)
Sodium: 137 mmol/L (ref 135–145)
Total Bilirubin: 1.2 mg/dL — ABNORMAL HIGH (ref <1.2)
Total Protein: 6.2 g/dL — ABNORMAL LOW (ref 6.5–8.1)

## 2023-10-09 LAB — CBC
HCT: 32.1 % — ABNORMAL LOW (ref 36.0–46.0)
Hemoglobin: 10.3 g/dL — ABNORMAL LOW (ref 12.0–15.0)
MCH: 25.9 pg — ABNORMAL LOW (ref 26.0–34.0)
MCHC: 32.1 g/dL (ref 30.0–36.0)
MCV: 80.9 fL (ref 80.0–100.0)
Platelets: 255 10*3/uL (ref 150–400)
RBC: 3.97 MIL/uL (ref 3.87–5.11)
RDW: 14.3 % (ref 11.5–15.5)
WBC: 12.3 10*3/uL — ABNORMAL HIGH (ref 4.0–10.5)
nRBC: 0 % (ref 0.0–0.2)

## 2023-10-09 LAB — SEDIMENTATION RATE: Sed Rate: 4 mm/h (ref 0–22)

## 2023-10-09 LAB — C-REACTIVE PROTEIN: CRP: <0.5 mg/dL (ref <1.0)

## 2023-10-09 LAB — VITAMIN D 25 HYDROXY (VIT D DEFICIENCY, FRACTURES): Vit D, 25-Hydroxy: 27.92 ng/mL — ABNORMAL LOW (ref 30–100)

## 2023-10-09 LAB — POCT PREGNANCY, URINE: Preg Test, Ur: NEGATIVE

## 2023-10-09 SURGERY — REMOVAL, HARDWARE
Anesthesia: General | Laterality: Left

## 2023-10-09 MED ORDER — HYDROMORPHONE HCL 1 MG/ML IJ SOLN
INTRAMUSCULAR | Status: AC
  Filled 2023-10-09: qty 0.5

## 2023-10-09 MED ORDER — ENOXAPARIN SODIUM 40 MG/0.4ML IJ SOSY
40.0000 mg | PREFILLED_SYRINGE | INTRAMUSCULAR | Status: DC
  Administered 2023-10-10 – 2023-10-12 (×3): 40 mg via SUBCUTANEOUS
  Filled 2023-10-09 (×3): qty 0.4

## 2023-10-09 MED ORDER — DOCUSATE SODIUM 100 MG PO CAPS
100.0000 mg | ORAL_CAPSULE | Freq: Two times a day (BID) | ORAL | Status: DC
  Administered 2023-10-09 – 2023-10-12 (×6): 100 mg via ORAL
  Filled 2023-10-09 (×6): qty 1

## 2023-10-09 MED ORDER — BUPIVACAINE-MELOXICAM ER 200-6 MG/7ML IJ SOLN
INTRAMUSCULAR | Status: AC
  Filled 2023-10-09: qty 1

## 2023-10-09 MED ORDER — MIDAZOLAM HCL 2 MG/2ML IJ SOLN
INTRAMUSCULAR | Status: AC
  Filled 2023-10-09: qty 2

## 2023-10-09 MED ORDER — BUPIVACAINE-MELOXICAM ER 400-12 MG/14ML IJ SOLN
INTRAMUSCULAR | Status: DC | PRN
  Administered 2023-10-09 (×2): 200 mg

## 2023-10-09 MED ORDER — OXYCODONE HCL 5 MG PO TABS: 5.0000 mg | ORAL_TABLET | ORAL | Status: DC | PRN

## 2023-10-09 MED ORDER — MIDAZOLAM HCL 2 MG/2ML IJ SOLN
INTRAMUSCULAR | Status: DC | PRN
  Administered 2023-10-09: 2 mg via INTRAVENOUS

## 2023-10-09 MED ORDER — BUPIVACAINE-MELOXICAM ER 400-12 MG/14ML IJ SOLN
INTRAMUSCULAR | Status: AC
  Filled 2023-10-09: qty 1

## 2023-10-09 MED ORDER — LACTATED RINGERS IV SOLN: INTRAVENOUS | Status: DC

## 2023-10-09 MED ORDER — OXYCODONE HCL 5 MG PO TABS
ORAL_TABLET | ORAL | Status: AC
  Filled 2023-10-09: qty 2

## 2023-10-09 MED ORDER — ACETAMINOPHEN 500 MG PO TABS
1000.0000 mg | ORAL_TABLET | Freq: Three times a day (TID) | ORAL | Status: AC
  Administered 2023-10-09 – 2023-10-10 (×4): 1000 mg via ORAL
  Filled 2023-10-09 (×4): qty 2

## 2023-10-09 MED ORDER — METHOCARBAMOL 1000 MG/10ML IJ SOLN
INTRAMUSCULAR | Status: AC
  Filled 2023-10-09: qty 10

## 2023-10-09 MED ORDER — OXYCODONE HCL 5 MG PO TABS
10.0000 mg | ORAL_TABLET | ORAL | Status: DC | PRN
  Administered 2023-10-09 – 2023-10-11 (×7): 10 mg via ORAL
  Filled 2023-10-09 (×7): qty 2

## 2023-10-09 MED ORDER — POTASSIUM CHLORIDE IN NACL 20-0.9 MEQ/L-% IV SOLN
INTRAVENOUS | Status: AC
  Filled 2023-10-09: qty 1000

## 2023-10-09 MED ORDER — CHLORHEXIDINE GLUCONATE 0.12 % MT SOLN
15.0000 mL | Freq: Once | OROMUCOSAL | Status: AC
  Administered 2023-10-09: 15 mL via OROMUCOSAL
  Filled 2023-10-09: qty 15

## 2023-10-09 MED ORDER — PROPOFOL 10 MG/ML IV BOLUS
INTRAVENOUS | Status: AC
Start: 2023-10-09 — End: ?
  Filled 2023-10-09: qty 20

## 2023-10-09 MED ORDER — HYDROMORPHONE HCL 1 MG/ML IJ SOLN
INTRAMUSCULAR | Status: DC | PRN
  Administered 2023-10-09: .5 mg via INTRAVENOUS

## 2023-10-09 MED ORDER — ORAL CARE MOUTH RINSE: 15.0000 mL | OROMUCOSAL | Status: DC | PRN

## 2023-10-09 MED ORDER — FENTANYL CITRATE (PF) 250 MCG/5ML IJ SOLN
INTRAMUSCULAR | Status: DC | PRN
  Administered 2023-10-09: 50 ug via INTRAVENOUS
  Administered 2023-10-09: 100 ug via INTRAVENOUS
  Administered 2023-10-09 (×2): 50 ug via INTRAVENOUS

## 2023-10-09 MED ORDER — METOCLOPRAMIDE HCL 5 MG/ML IJ SOLN: 5.0000 mg | Freq: Three times a day (TID) | INTRAMUSCULAR | Status: DC | PRN

## 2023-10-09 MED ORDER — PROPOFOL 10 MG/ML IV BOLUS
INTRAVENOUS | Status: DC | PRN
  Administered 2023-10-09: 150 mg via INTRAVENOUS

## 2023-10-09 MED ORDER — METHOCARBAMOL 500 MG PO TABS
500.0000 mg | ORAL_TABLET | Freq: Four times a day (QID) | ORAL | Status: DC | PRN
  Administered 2023-10-09 – 2023-10-10 (×4): 500 mg via ORAL
  Filled 2023-10-09 (×4): qty 1

## 2023-10-09 MED ORDER — OXYCODONE HCL 5 MG PO TABS
5.0000 mg | ORAL_TABLET | Freq: Once | ORAL | Status: DC | PRN
Start: 2023-10-09 — End: 2023-10-09

## 2023-10-09 MED ORDER — FENTANYL CITRATE (PF) 100 MCG/2ML IJ SOLN
25.0000 ug | INTRAMUSCULAR | Status: DC | PRN
  Administered 2023-10-09: 25 ug via INTRAVENOUS
  Administered 2023-10-09: 50 ug via INTRAVENOUS

## 2023-10-09 MED ORDER — DEXMEDETOMIDINE HCL IN NACL 80 MCG/20ML IV SOLN
INTRAVENOUS | Status: DC | PRN
  Administered 2023-10-09 (×4): 4 ug via INTRAVENOUS

## 2023-10-09 MED ORDER — METOCLOPRAMIDE HCL 5 MG PO TABS: 5.0000 mg | ORAL_TABLET | Freq: Three times a day (TID) | ORAL | Status: DC | PRN

## 2023-10-09 MED ORDER — 0.9 % SODIUM CHLORIDE (POUR BTL) OPTIME
TOPICAL | Status: DC | PRN
  Administered 2023-10-09: 1000 mL

## 2023-10-09 MED ORDER — ORAL CARE MOUTH RINSE
15.0000 mL | Freq: Once | OROMUCOSAL | Status: AC
Start: 2023-10-09 — End: 2023-10-09

## 2023-10-09 MED ORDER — INFLUENZA VIRUS VACC SPLIT PF (FLUZONE) 0.5 ML IM SUSY
0.5000 mL | PREFILLED_SYRINGE | INTRAMUSCULAR | Status: AC
  Administered 2023-10-12: 0.5 mL via INTRAMUSCULAR
  Filled 2023-10-09: qty 0.5

## 2023-10-09 MED ORDER — HYDROMORPHONE HCL 1 MG/ML IJ SOLN: 0.5000 mg | INTRAMUSCULAR | Status: DC | PRN

## 2023-10-09 MED ORDER — OXYCODONE HCL 5 MG/5ML PO SOLN: 5.0000 mg | Freq: Once | ORAL | Status: DC | PRN

## 2023-10-09 MED ORDER — AMISULPRIDE (ANTIEMETIC) 5 MG/2ML IV SOLN: 10.0000 mg | Freq: Once | INTRAVENOUS | Status: DC | PRN

## 2023-10-09 MED ORDER — ONDANSETRON HCL 4 MG PO TABS: 4.0000 mg | ORAL_TABLET | Freq: Four times a day (QID) | ORAL | Status: DC | PRN

## 2023-10-09 MED ORDER — FENTANYL CITRATE (PF) 250 MCG/5ML IJ SOLN
INTRAMUSCULAR | Status: AC
  Filled 2023-10-09: qty 5

## 2023-10-09 MED ORDER — ONDANSETRON HCL 4 MG/2ML IJ SOLN: 4.0000 mg | Freq: Four times a day (QID) | INTRAMUSCULAR | Status: DC | PRN

## 2023-10-09 MED ORDER — ACETAMINOPHEN 325 MG PO TABS
325.0000 mg | ORAL_TABLET | Freq: Four times a day (QID) | ORAL | Status: DC | PRN
  Administered 2023-10-11: 650 mg via ORAL
  Filled 2023-10-09: qty 2

## 2023-10-09 MED ORDER — ALBUMIN HUMAN 5 % IV SOLN: INTRAVENOUS | Status: DC | PRN

## 2023-10-09 MED ORDER — CEFAZOLIN SODIUM-DEXTROSE 1-4 GM/50ML-% IV SOLN
1.0000 g | Freq: Four times a day (QID) | INTRAVENOUS | Status: AC
  Administered 2023-10-09 – 2023-10-10 (×3): 1 g via INTRAVENOUS
  Filled 2023-10-09 (×3): qty 50

## 2023-10-09 MED ORDER — ONDANSETRON HCL 4 MG/2ML IJ SOLN
INTRAMUSCULAR | Status: DC | PRN
  Administered 2023-10-09: 4 mg via INTRAVENOUS

## 2023-10-09 MED ORDER — CEFAZOLIN SODIUM-DEXTROSE 1-4 GM/50ML-% IV SOLN
INTRAVENOUS | Status: DC | PRN
  Administered 2023-10-09: 2 g via INTRAVENOUS

## 2023-10-09 MED ORDER — METHOCARBAMOL 1000 MG/10ML IJ SOLN
500.0000 mg | Freq: Four times a day (QID) | INTRAMUSCULAR | Status: DC | PRN
  Administered 2023-10-09: 500 mg via INTRAVENOUS

## 2023-10-09 MED ORDER — DEXAMETHASONE SODIUM PHOSPHATE 10 MG/ML IJ SOLN
INTRAMUSCULAR | Status: DC | PRN
  Administered 2023-10-09: 4 mg via INTRAVENOUS

## 2023-10-09 MED ORDER — ACETAMINOPHEN 500 MG PO TABS
1000.0000 mg | ORAL_TABLET | Freq: Once | ORAL | Status: AC
  Administered 2023-10-09: 1000 mg via ORAL
  Filled 2023-10-09: qty 2

## 2023-10-09 MED ORDER — LIDOCAINE 2% (20 MG/ML) 5 ML SYRINGE
INTRAMUSCULAR | Status: DC | PRN
  Administered 2023-10-09: 80 mg via INTRAVENOUS

## 2023-10-09 MED ORDER — FENTANYL CITRATE (PF) 100 MCG/2ML IJ SOLN
INTRAMUSCULAR | Status: AC
  Filled 2023-10-09: qty 2

## 2023-10-09 SURGICAL SUPPLY — 102 items
BAG COUNTER SPONGE SURGICOUNT (BAG) ×1 IMPLANT
BANDAGE ESMARK 6X9 LF (GAUZE/BANDAGES/DRESSINGS) ×1 IMPLANT
BIT DRILL 2.0 LNG QUCK RELEASE (BIT) IMPLANT
BIT DRILL 2.3 QUICK RELEASE (BIT) IMPLANT
BIT DRILL 2.8 QUICK RELEASE (BIT) IMPLANT
BLADE CLIPPER SURG (BLADE) ×1 IMPLANT
BLADE SURG 10 STRL SS (BLADE) IMPLANT
BNDG COHESIVE 6X5 TAN ST LF (GAUZE/BANDAGES/DRESSINGS) ×1 IMPLANT
BNDG ELASTIC 3INX 5YD STR LF (GAUZE/BANDAGES/DRESSINGS) IMPLANT
BNDG ELASTIC 4INX 5YD STR LF (GAUZE/BANDAGES/DRESSINGS) IMPLANT
BNDG ELASTIC 4X5.8 VLCR STR LF (GAUZE/BANDAGES/DRESSINGS) ×1 IMPLANT
BNDG ELASTIC 6X5.8 VLCR STR LF (GAUZE/BANDAGES/DRESSINGS) ×1 IMPLANT
BNDG ESMARK 4X9 LF (GAUZE/BANDAGES/DRESSINGS) IMPLANT
BNDG ESMARK 6X9 LF (GAUZE/BANDAGES/DRESSINGS)
BNDG GAUZE DERMACEA FLUFF 4 (GAUZE/BANDAGES/DRESSINGS) ×2 IMPLANT
BONE CANC CHIPS 20CC PCAN1/4 (Bone Implant) ×1 IMPLANT
BRUSH SCRUB EZ PLAIN DRY (MISCELLANEOUS) ×2 IMPLANT
CHIPS CANC BONE 20CC PCAN1/4 (Bone Implant) ×1 IMPLANT
CNTNR URN SCR LID CUP LEK RST (MISCELLANEOUS) IMPLANT
CONT SPEC 4OZ STRL OR WHT (MISCELLANEOUS) ×1
COVER SURGICAL LIGHT HANDLE (MISCELLANEOUS) ×2 IMPLANT
CUFF TOURN SGL QUICK 18X4 (TOURNIQUET CUFF) IMPLANT
CUFF TOURN SGL QUICK 24 (TOURNIQUET CUFF)
CUFF TOURN SGL QUICK 34 (TOURNIQUET CUFF)
CUFF TRNQT CYL 24X4X16.5-23 (TOURNIQUET CUFF) IMPLANT
CUFF TRNQT CYL 34X4.125X (TOURNIQUET CUFF) IMPLANT
DRAPE C-ARM 42X72 X-RAY (DRAPES) IMPLANT
DRAPE C-ARMOR (DRAPES) ×1 IMPLANT
DRAPE INCISE IOBAN 66X45 STRL (DRAPES) IMPLANT
DRAPE U-SHAPE 47X51 STRL (DRAPES) ×1 IMPLANT
DRILL 2.0 LNG QUICK RELEASE (BIT) ×1
DRILL 2.3 QUICK RELEASE (BIT) ×1
DRILL 2.8 QUICK RELEASE (BIT) ×1
DRSG ADAPTIC 3X8 NADH LF (GAUZE/BANDAGES/DRESSINGS) ×1 IMPLANT
DRSG EMULSION OIL 3X3 NADH (GAUZE/BANDAGES/DRESSINGS) IMPLANT
DRSG MEPILEX POST OP 4X8 (GAUZE/BANDAGES/DRESSINGS) IMPLANT
DRSG MEPITEL 4X7.2 (GAUZE/BANDAGES/DRESSINGS) IMPLANT
ELECT REM PT RETURN 9FT ADLT (ELECTROSURGICAL) ×1
ELECTRODE REM PT RTRN 9FT ADLT (ELECTROSURGICAL) ×1 IMPLANT
GAUZE SPONGE 4X4 12PLY STRL (GAUZE/BANDAGES/DRESSINGS) ×1 IMPLANT
GLOVE BIO SURGEON STRL SZ7.5 (GLOVE) ×1 IMPLANT
GLOVE BIO SURGEON STRL SZ8 (GLOVE) ×1 IMPLANT
GLOVE BIOGEL PI IND STRL 7.5 (GLOVE) ×1 IMPLANT
GLOVE BIOGEL PI IND STRL 8 (GLOVE) ×1 IMPLANT
GLOVE SKINSENSE STRL SZ7.0 (GLOVE) IMPLANT
GLOVE SURG ORTHO LTX SZ7.5 (GLOVE) ×2 IMPLANT
GOWN STRL REUS W/ TWL LRG LVL3 (GOWN DISPOSABLE) ×2 IMPLANT
GOWN STRL REUS W/ TWL XL LVL3 (GOWN DISPOSABLE) ×1 IMPLANT
GOWN STRL REUS W/TWL LRG LVL3 (GOWN DISPOSABLE) ×2
GOWN STRL REUS W/TWL XL LVL3 (GOWN DISPOSABLE) ×1
GRAFT BNE CANC CHIPS 1-8 20CC (Bone Implant) IMPLANT
GUIDEWIRE ORTH 6X062XTROC NS (WIRE) IMPLANT
GUIDEWIRE ORTHO MINI ACTK .045 (WIRE) IMPLANT
K-WIRE .062 (WIRE) ×3
KIT BASIN OR (CUSTOM PROCEDURE TRAY) ×1 IMPLANT
KIT TURNOVER KIT B (KITS) ×1 IMPLANT
MANIFOLD NEPTUNE II (INSTRUMENTS) ×1 IMPLANT
NDL 22X1.5 STRL (OR ONLY) (MISCELLANEOUS) IMPLANT
NDL HYPO 25GX1X1/2 BEV (NEEDLE) IMPLANT
NEEDLE 22X1.5 STRL (OR ONLY) (MISCELLANEOUS) IMPLANT
NEEDLE HYPO 25GX1X1/2 BEV (NEEDLE) IMPLANT
NS IRRIG 1000ML POUR BTL (IV SOLUTION) ×1 IMPLANT
PACK ORTHO EXTREMITY (CUSTOM PROCEDURE TRAY) ×1 IMPLANT
PAD ARMBOARD 7.5X6 YLW CONV (MISCELLANEOUS) ×2 IMPLANT
PAD CAST 3X4 CTTN HI CHSV (CAST SUPPLIES) ×1 IMPLANT
PADDING CAST ABS COTTON 3X4 (CAST SUPPLIES) IMPLANT
PADDING CAST ABS COTTON 6X4 NS (CAST SUPPLIES) IMPLANT
PADDING CAST COTTON 6X4 STRL (CAST SUPPLIES) ×3 IMPLANT
PLATE BONE OLECRAN 6H 11H LCKG (Plate) IMPLANT
PLATE OLECRANON 6H 11H (Plate) ×1 IMPLANT
SCREW BONE NL 3.0X16 HEXA (Screw) IMPLANT
SCREW BONE NLCKG 3.0X18 HEXA (Screw) IMPLANT
SCREW BONE NON-LOCK 3.0X16 (Screw) ×3 IMPLANT
SCREW BONE NONLOCK 3.0X18 (Screw) ×2 IMPLANT
SCREW HEX LOCK 2.7X16MM (Screw) IMPLANT
SCREW LOCK 38X3.5X HEXALOBE (Screw) IMPLANT
SCREW NON LOCK 3.5X40 (Screw) IMPLANT
SOL PREP POV-IOD 4OZ 10% (MISCELLANEOUS) IMPLANT
SOL SCRUB PVP POV-IOD 4OZ 7.5% (MISCELLANEOUS) ×1
SOLUTION SCRB POV-IOD 4OZ 7.5% (MISCELLANEOUS) IMPLANT
SPIKE FLUID TRANSFER (MISCELLANEOUS) IMPLANT
SPONGE SURGIFOAM ABS GEL 12-7 (HEMOSTASIS) IMPLANT
SPONGE T-LAP 18X18 ~~LOC~~+RFID (SPONGE) ×1 IMPLANT
STAPLER VISISTAT 35W (STAPLE) IMPLANT
STOCKINETTE IMPERVIOUS LG (DRAPES) ×1 IMPLANT
STRIP CLOSURE SKIN 1/2X4 (GAUZE/BANDAGES/DRESSINGS) IMPLANT
SUCTION TUBE FRAZIER 10FR DISP (SUCTIONS) IMPLANT
SUT ETHILON 2 0 FS 18 (SUTURE) IMPLANT
SUT PDS AB 2-0 CT1 27 (SUTURE) IMPLANT
SUT VIC AB 0 CT1 27XBRD ANBCTR (SUTURE) IMPLANT
SUT VIC AB 1 CT1 27XBRD ANBCTR (SUTURE) IMPLANT
SUT VIC AB 2-0 CT1 TAPERPNT 27 (SUTURE) ×1 IMPLANT
SUT VIC AB CT1 27XBRD ANBCTRL (SUTURE) ×2
SWAB COLLECTION DEVICE MRSA (MISCELLANEOUS) IMPLANT
SWAB CULTURE ESWAB REG 1ML (MISCELLANEOUS) IMPLANT
SYR CONTROL 10ML LL (SYRINGE) IMPLANT
TOWEL GREEN STERILE (TOWEL DISPOSABLE) ×2 IMPLANT
TOWEL GREEN STERILE FF (TOWEL DISPOSABLE) ×2 IMPLANT
TUBE CONNECTING 12X1/4 (SUCTIONS) ×1 IMPLANT
UNDERPAD 30X36 HEAVY ABSORB (UNDERPADS AND DIAPERS) ×1 IMPLANT
WATER STERILE IRR 1000ML POUR (IV SOLUTION) ×2 IMPLANT
YANKAUER SUCT BULB TIP NO VENT (SUCTIONS) ×1 IMPLANT

## 2023-10-09 NOTE — Transfer of Care (Signed)
Immediate Anesthesia Transfer of Care Note  Patient: Karla Nichols  Procedure(s) Performed: HARDWARE REMOVAL (Left) NONUNION REPAIR ULNA WITH ILIAC CREST BONE GRAFTING (Left)  Patient Location: PACU  Anesthesia Type:General  Level of Consciousness: awake and alert   Airway & Oxygen Therapy: Patient Spontanous Breathing  Post-op Assessment: Report given to RN and Post -op Vital signs reviewed and stable  Post vital signs: Reviewed and stable  Last Vitals:  Vitals Value Taken Time  BP 135/93 10/09/23 1305  Temp 36.6 C 10/09/23 1305  Pulse 92 10/09/23 1307  Resp 10 10/09/23 1307  SpO2 99 % 10/09/23 1307  Vitals shown include unfiled device data.  Last Pain:  Vitals:   10/09/23 0638  TempSrc:   PainSc: 0-No pain      Patients Stated Pain Goal: 0 (10/09/23 4010)  Complications: No notable events documented.

## 2023-10-09 NOTE — Anesthesia Postprocedure Evaluation (Signed)
Anesthesia Post Note  Patient: Karla Nichols  Procedure(s) Performed: HARDWARE REMOVAL (Left) NONUNION REPAIR ULNA WITH ILIAC CREST BONE GRAFTING (Left)     Patient location during evaluation: PACU Anesthesia Type: General Level of consciousness: awake Pain management: pain level controlled Vital Signs Assessment: post-procedure vital signs reviewed and stable Respiratory status: spontaneous breathing, nonlabored ventilation and respiratory function stable Cardiovascular status: blood pressure returned to baseline and stable Postop Assessment: no apparent nausea or vomiting Anesthetic complications: no   No notable events documented.  Last Vitals:  Vitals:   10/09/23 1430 10/09/23 1445  BP: 135/87 (!) 132/93  Pulse: 82 80  Resp: 18 11  Temp:    SpO2: 97% 98%    Last Pain:  Vitals:   10/09/23 1445  TempSrc:   PainSc: 7                  Linton Rump

## 2023-10-09 NOTE — Anesthesia Procedure Notes (Signed)
Procedure Name: LMA Insertion Date/Time: 10/09/2023 8:36 AM  Performed by: Sandie Ano, CRNAPre-anesthesia Checklist: Patient identified, Emergency Drugs available, Suction available and Patient being monitored Patient Re-evaluated:Patient Re-evaluated prior to induction Oxygen Delivery Method: Circle System Utilized Preoxygenation: Pre-oxygenation with 100% oxygen Induction Type: IV induction Ventilation: Mask ventilation without difficulty LMA: LMA inserted LMA Size: 4.0 Number of attempts: 1 Airway Equipment and Method: Bite block Placement Confirmation: positive ETCO2 Tube secured with: Tape Dental Injury: Teeth and Oropharynx as per pre-operative assessment

## 2023-10-09 NOTE — Progress Notes (Signed)
Orthopedic Tech Progress Note Patient Details:  Karla Nichols February 18, 1986 [id number]  Patient ID: [name] Dec, female   DOB: 06-10-1986, 37 y.o.   MRN: [id number] Order received for overhead trapeze.  Pt with LUE impairment currently so per protocol, will hold off on trapeze.  Napoleon Monacelli OTR/L 10/09/2023, 5:01 PM

## 2023-10-10 ENCOUNTER — Encounter (HOSPITAL_COMMUNITY): Payer: Self-pay | Admitting: Orthopedic Surgery

## 2023-10-10 DIAGNOSIS — S52002K Unspecified fracture of upper end of left ulna, subsequent encounter for closed fracture with nonunion: Secondary | ICD-10-CM | POA: Diagnosis not present

## 2023-10-10 DIAGNOSIS — G119 Hereditary ataxia, unspecified: Secondary | ICD-10-CM | POA: Diagnosis not present

## 2023-10-10 DIAGNOSIS — W19XXXD Unspecified fall, subsequent encounter: Secondary | ICD-10-CM | POA: Diagnosis not present

## 2023-10-10 DIAGNOSIS — R296 Repeated falls: Secondary | ICD-10-CM | POA: Diagnosis not present

## 2023-10-10 DIAGNOSIS — Z23 Encounter for immunization: Secondary | ICD-10-CM | POA: Diagnosis not present

## 2023-10-10 DIAGNOSIS — S52132K Displaced fracture of neck of left radius, subsequent encounter for closed fracture with nonunion: Secondary | ICD-10-CM | POA: Diagnosis not present

## 2023-10-10 DIAGNOSIS — E559 Vitamin D deficiency, unspecified: Secondary | ICD-10-CM | POA: Diagnosis not present

## 2023-10-10 DIAGNOSIS — S52202K Unspecified fracture of shaft of left ulna, subsequent encounter for closed fracture with nonunion: Secondary | ICD-10-CM | POA: Diagnosis not present

## 2023-10-10 DIAGNOSIS — T84123A Displacement of internal fixation device of bone of left forearm, initial encounter: Secondary | ICD-10-CM | POA: Diagnosis not present

## 2023-10-10 LAB — CBC
HCT: 32.5 % — ABNORMAL LOW (ref 36.0–46.0)
Hemoglobin: 10.2 g/dL — ABNORMAL LOW (ref 12.0–15.0)
MCH: 25.8 pg — ABNORMAL LOW (ref 26.0–34.0)
MCHC: 31.4 g/dL (ref 30.0–36.0)
MCV: 82.1 fL (ref 80.0–100.0)
Platelets: 214 10*3/uL (ref 150–400)
RBC: 3.96 MIL/uL (ref 3.87–5.11)
RDW: 14.5 % (ref 11.5–15.5)
WBC: 9.2 10*3/uL (ref 4.0–10.5)
nRBC: 0 % (ref 0.0–0.2)

## 2023-10-10 MED ORDER — METAXALONE 800 MG PO TABS
400.0000 mg | ORAL_TABLET | Freq: Three times a day (TID) | ORAL | Status: DC
  Administered 2023-10-10 – 2023-10-12 (×8): 400 mg via ORAL
  Filled 2023-10-10 (×9): qty 0.5

## 2023-10-10 MED ORDER — KETOROLAC TROMETHAMINE 15 MG/ML IJ SOLN: 15.0000 mg | Freq: Three times a day (TID) | INTRAMUSCULAR | Status: DC

## 2023-10-10 MED ORDER — METAXALONE 400 MG HALF TABLET
400.0000 mg | ORAL_TABLET | Freq: Three times a day (TID) | ORAL | Status: DC
  Filled 2023-10-10: qty 1

## 2023-10-10 MED ORDER — KETOROLAC TROMETHAMINE 15 MG/ML IJ SOLN
15.0000 mg | Freq: Three times a day (TID) | INTRAMUSCULAR | Status: DC
  Administered 2023-10-10 – 2023-10-12 (×6): 15 mg via INTRAVENOUS
  Filled 2023-10-10 (×6): qty 1

## 2023-10-10 NOTE — Progress Notes (Signed)
Orthopaedic Trauma Service Progress Note  Patient ID: Karla Nichols MRN: [id number] DOB/AGE: 01-31-1986 37 y.o.  Subjective:  Moderate pain this morning left forearm and left hip from iliac crest bone graft harvest site No other specific complaints Would like to stay another night for pain control  Intraoperative cultures showed no growth to date   ROS As above Objective:   VITALS:   Vitals:   10/09/23 2037 10/09/23 2314 10/09/23 2315 10/10/23 0345  BP: 121/77 114/68  107/71  Pulse: (!) 109 (!) 107 (!) 102 87  Resp: 19 18  16   Temp: 98.5 F (36.9 C) 99 F (37.2 C)  98.5 F (36.9 C)  TempSrc: Oral Oral  Oral  SpO2: 98% 98%  100%  Weight:      Height:        Estimated body mass index is 26.39 kg/m as calculated from the following:   Height as of this encounter: 5\' 3"  (1.6 m).   Weight as of this encounter: 67.6 kg.   Intake/Output      11/19 0701 11/20 0700 11/20 0701 11/21 0700   I.V. (mL/kg) 1606.7 (23.8)    IV Piggyback 261.7    Total Intake(mL/kg) 1868.3 (27.6)    Urine (mL/kg/hr) 200 (0.1) 350 (1.3)   Blood 300    Total Output 500 350   Net +1368.3 -350        Urine Occurrence 1 x      LABS  Results for orders placed or performed during the hospital encounter of 10/09/23 (from the past 24 hour(s))  CBC     Status: Abnormal   Collection Time: 10/09/23  8:20 PM  Result Value Ref Range   WBC 12.3 (H) 4.0 - 10.5 K/uL   RBC 3.97 3.87 - 5.11 MIL/uL   Hemoglobin 10.3 (L) 12.0 - 15.0 g/dL   HCT 30.8 (L) 65.7 - 84.6 %   MCV 80.9 80.0 - 100.0 fL   MCH 25.9 (L) 26.0 - 34.0 pg   MCHC 32.1 30.0 - 36.0 g/dL   RDW 96.2 95.2 - 84.1 %   Platelets 255 150 - 400 K/uL   nRBC 0.0 0.0 - 0.2 %  Comprehensive metabolic panel     Status: Abnormal   Collection Time: 10/09/23  8:20 PM  Result Value Ref Range   Sodium 137 135 - 145 mmol/L   Potassium 4.2 3.5 - 5.1 mmol/L   Chloride 106 98 -  111 mmol/L   CO2 23 22 - 32 mmol/L   Glucose, Bld 180 (H) 70 - 99 mg/dL   BUN 11 6 - 20 mg/dL   Creatinine, Ser 3.24 0.44 - 1.00 mg/dL   Calcium 8.3 (L) 8.9 - 10.3 mg/dL   Total Protein 6.2 (L) 6.5 - 8.1 g/dL   Albumin 3.5 3.5 - 5.0 g/dL   AST 23 15 - 41 U/L   ALT 16 0 - 44 U/L   Alkaline Phosphatase 43 38 - 126 U/L   Total Bilirubin 1.2 (H) <1.2 mg/dL   GFR, Estimated >40 >10 mL/min   Anion gap 8 5 - 15  CBC     Status: Abnormal   Collection Time: 10/10/23  7:32 AM  Result Value Ref Range   WBC 9.2 4.0 - 10.5 K/uL   RBC 3.96 3.87 - 5.11 MIL/uL  Hemoglobin 10.2 (L) 12.0 - 15.0 g/dL   HCT 82.9 (L) 56.2 - 13.0 %   MCV 82.1 80.0 - 100.0 fL   MCH 25.8 (L) 26.0 - 34.0 pg   MCHC 31.4 30.0 - 36.0 g/dL   RDW 86.5 78.4 - 69.6 %   Platelets 214 150 - 400 K/uL   nRBC 0.0 0.0 - 0.2 %     PHYSICAL EXAM:   Gen: Sitting in bedside chair, pleasant but does appear to be uncomfortable Lungs: Unlabored Cardiac: Regular Ext:       Left upper extremity  Dressing is clean, dry and intact  Extremity is warm  Capillary refill  Radial, ulnar, median nerve motor and sensory functions intact.  AIN and PIN motor functions intact  Mild swelling  No pain out of proportion with passive stretching of her fingers       Left pelvis/hip  Dressing is stable    Assessment/Plan: 1 Day Post-Op   Principal Problem:   Closed fracture of left ulna with nonunion   Anti-infectives (From admission, onward)    Start     Dose/Rate Route Frequency Ordered Stop   10/09/23 1800  ceFAZolin (ANCEF) IVPB 1 g/50 mL premix        1 g 100 mL/hr over 30 Minutes Intravenous Every 6 hours 10/09/23 1625 10/10/23 0517     .  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  -Left ulnar nonunion s/p removal of hardware, autografting with left iliac crest bone graft, placement of new ulnar plate Weightbearing No lifting with left upper extremity Try to limit axial loading on left  forearm   ROM/Activity   As above   Digit and wrist motion as tolerated   Wound care   Dressing change tomorrow prior to departure   Continue with therapies  - Pain management:  Multimodal  - Medical issues   Gait disturbance   Increases risk of falls  - ID:   Perioperative antibiotics    Intraoperative cultures showed no growth to date   - Dispo:  Pain control   Home tomorrow      [name], PA-C [phone] (C) 10/10/2023, 11:06 AM  [facility] [address] [place] [address] [phone] [phone] (F)    After 5pm and on the weekends please log on to Amion, go to orthopaedics and the look under the [facility] Call for the provider(s) on call. You can also call our office at [phone] and then follow the prompts to be connected to the call team.  Patient ID: Karla Nichols, female   DOB: 1986/01/15, 37 y.o.   MRN: [id number]

## 2023-10-10 NOTE — Progress Notes (Signed)
-    s/p HARDWARE REMOVAL (Left) NONUNION REPAIR ULNA WITH ILIAC CREST BONE GRAFTING (Left), 11/19 Hx of cerebellar ataxia, falls.   10/10/23 0829  TOC Brief Assessment  Insurance and Status Reviewed  Patient has primary care physician Yes  Home environment has been reviewed From home with father. Works [employer].  Prior level of function: PTA independent with ADL's, no DME usage.  Prior/Current Home Services No current home services  Social Determinants of Health Reivew SDOH reviewed no interventions necessary  Readmission risk has been reviewed No  Transition of care needs no transition of care needs at this time   PT/OT evaluations pending.... Pt with without transportation issues or RX med concerns. TOC team following and will assist with needs as presented. Gae Gallop RN,BSN,CM [phone]

## 2023-10-10 NOTE — Evaluation (Signed)
Physical Therapy Evaluation Patient Details Name: Karla Nichols MRN: [id number] DOB: April 27, 1986 Today's Date: 10/10/2023  History of Present Illness  37 y.o. 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 PMHx: cerebellar ataxia, prior ORIF ulnar fx in 2017 and 2018.   Clinical Impression  Pt in bed upon arrival and agreeable to PT eval. Prior to admission, pt was independent w/ mobility with no AD. Pt reports prior falls and instability for years, however, has not had PT services and is not interested in post acute PT. Pt was limited in today's eval by pain in L hip with WB. Pt was able to take a few steps with 1UE support on IV pole to the recliner. Anticipate pt will progress well with continued pain management. Pt presents to therapy session with decreased balance, activity tolerance, and mobility. Pt would benefit from acute skilled PT to address functional impairments. Acute PT to follow.          If plan is discharge home, recommend the following: A little help with walking and/or transfers;A little help with bathing/dressing/bathroom;Assistance with cooking/housework;Help with stairs or ramp for entrance;Assist for transportation   Can travel by private vehicle    Yes    Equipment Recommendations None recommended by PT (Will continue to assess, may need some UE support)     Functional Status Assessment Patient has had a recent decline in their functional status and demonstrates the ability to make significant improvements in function in a reasonable and predictable amount of time.     Precautions / Restrictions Precautions Precautions: Fall Required Braces or Orthoses:  (No UE sling in room) Restrictions Weight Bearing Restrictions: Yes LUE Weight Bearing: Non weight bearing      Mobility  Bed Mobility Overal bed mobility: Modified Independent  General bed mobility comments: HOB elevated    Transfers Overall transfer level:  Needs assistance Equipment used: None Transfers: Sit to/from Stand Sit to Stand: Contact guard assist      General transfer comment: With initial WB, pt had severe pain in L hip and immediately returned to sitting. CGA for safety with x2 stands. Pt reached for IV pole upon standing due to pain    Ambulation/Gait Ambulation/Gait assistance: Contact guard assist Gait Distance (Feet): 4 Feet Assistive device: IV Pole Gait Pattern/deviations: Step-through pattern, Decreased stride length, Shuffle Gait velocity: dec     General Gait Details: decreased WB on L LE due to pain, short shuffled steps towards recliner    Balance Overall balance assessment: Needs assistance, History of Falls Sitting-balance support: No upper extremity supported, Feet supported Sitting balance-Leahy Scale: Good     Standing balance support: Single extremity supported, During functional activity, Reliant on assistive device for balance Standing balance-Leahy Scale: Fair Standing balance comment: pt able to stand w/ no AD, used 1UE support due to pain when ambulating           Pertinent Vitals/Pain Pain Assessment Pain Assessment: Faces Faces Pain Scale: Hurts even more Pain Location: L UE Pain Descriptors / Indicators: Aching, Discomfort Pain Intervention(s): Limited activity within patient's tolerance, Monitored during session, Repositioned, Patient requesting pain meds-RN notified    Home Living Family/patient expects to be discharged to:: Private residence Living Arrangements: Non-relatives/Friends Available Help at Discharge: Available 24 hours/day;Friend(s) (father uses WC, unable to help physically. Will d/c at friends house) Type of Home: House Home Access: Stairs to enter Entrance Stairs-Rails: None Entrance Stairs-Number of Steps: 1   Home Layout: One  level        Prior Function Prior Level of Function : Needs assist;Working/employed;History of Falls (last six months)        Physical Assist : Mobility (physical) Mobility (physical): Stairs   Mobility Comments: needs help for stairs, reports falls and unsteadiness for years. Has not had PT and is not interested ADLs Comments: independent with ADLs     Extremity/Trunk Assessment   Upper Extremity Assessment Upper Extremity Assessment: Defer to OT evaluation    Lower Extremity Assessment Lower Extremity Assessment: Overall WFL for tasks assessed (pain in L hip, deferred MMT)    Cervical / Trunk Assessment Cervical / Trunk Assessment: Normal  Communication   Communication Communication: No apparent difficulties Cueing Techniques: Verbal cues  Cognition Arousal: Alert Behavior During Therapy: WFL for tasks assessed/performed Overall Cognitive Status: Within Functional Limits for tasks assessed  General Comments: A&Ox4        General Comments General comments (skin integrity, edema, etc.): VSS on RA, tearful upon standing due to unexpected pain in L hip     PT Assessment Patient needs continued PT services  PT Problem List Decreased activity tolerance;Decreased balance;Decreased mobility;Decreased knowledge of use of DME;Decreased strength;Decreased range of motion       PT Treatment Interventions DME instruction;Gait training;Stair training;Functional mobility training;Therapeutic activities;Therapeutic exercise;Balance training;Neuromuscular re-education;Patient/family education    PT Goals (Current goals can be found in the Care Plan section)  Acute Rehab PT Goals Patient Stated Goal: to get better PT Goal Formulation: With patient Time For Goal Achievement: 10/24/23 Potential to Achieve Goals: Good    Frequency Min 1X/week        AM-PAC PT "6 Clicks" Mobility  Outcome Measure Help needed turning from your back to your side while in a flat bed without using bedrails?: None Help needed moving from lying on your back to sitting on the side of a flat bed without using bedrails?:  None Help needed moving to and from a bed to a chair (including a wheelchair)?: A Little Help needed standing up from a chair using your arms (e.g., wheelchair or bedside chair)?: A Little Help needed to walk in hospital room?: Total Help needed climbing 3-5 steps with a railing? : Total 6 Click Score: 16    End of Session Equipment Utilized During Treatment: Gait belt Activity Tolerance: Patient limited by pain Patient left: in chair;with call bell/phone within reach;with chair alarm set Nurse Communication: Mobility status;Patient requests pain meds PT Visit Diagnosis: Unsteadiness on feet (R26.81);Repeated falls (R29.6)    Time: 4401-0272 PT Time Calculation (min) (ACUTE ONLY): 21 min   Charges:   PT Evaluation $PT Eval Low Complexity: 1 Low   PT General Charges $$ ACUTE PT VISIT: 1 Visit         Hilton Cork, PT, DPT Secure Chat Preferred  Rehab Office [phone]   Arturo Morton Brion Aliment 10/10/2023, 9:10 AM

## 2023-10-10 NOTE — Evaluation (Signed)
Occupational Therapy Evaluation Patient Details Name: Karla Nichols MRN: [id number] DOB: June 07, 1986 Today's Date: 10/10/2023   History of Present Illness 37 y.o. 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 PMHx: cerebellar ataxia, prior ORIF ulnar fx in 2017 and 2018.   Clinical Impression   Pt admitted for above, post surgery she has a bulky dressing on her LUE which further limits her ROM and c/o pain in L arm. Reinforced AROM of uninvolved joints, educated pt on ADL completion using hemi strategies. Pt demonstrated understanding of UBD/LBD dressing using predominantly her RUE. Pt would benefit from continue acute skilled OT services to address deficits and educated on compensatory strategies prn. Recommend pt follow-up with Outpatient OT when able to progress with strengthening and ROM.        If plan is discharge home, recommend the following: Assistance with cooking/housework;Assist for transportation    Functional Status Assessment  Patient has had a recent decline in their functional status and demonstrates the ability to make significant improvements in function in a reasonable and predictable amount of time.  Equipment Recommendations  None recommended by OT    Recommendations for Other Services       Precautions / Restrictions Precautions Precautions: Fall Required Braces or Orthoses:  (No UE sling in room) Restrictions Weight Bearing Restrictions: Yes LUE Weight Bearing: Non weight bearing      Mobility Bed Mobility               General bed mobility comments: up in recliner on arrival    Transfers Overall transfer level: Needs assistance Equipment used: 1 person hand held assist Transfers: Sit to/from Stand, Bed to chair/wheelchair/BSC Sit to Stand: Contact guard assist                  Balance Overall balance assessment: Needs assistance, History of Falls Sitting-balance support: No upper extremity  supported, Feet supported Sitting balance-Leahy Scale: Good Sitting balance - Comments: In recliner, not fully assessed   Standing balance support: Single extremity supported, During functional activity, Reliant on assistive device for balance Standing balance-Leahy Scale: Fair                             ADL either performed or assessed with clinical judgement   ADL Overall ADL's : Needs assistance/impaired Eating/Feeding: Sitting;Modified independent Eating/Feeding Details (indicate cue type and reason): using RUE Grooming: Sitting;Set up   Upper Body Bathing: Sitting;Set up   Lower Body Bathing: Sitting/lateral leans;Set up   Upper Body Dressing : Sitting;Supervision/safety;Set up Upper Body Dressing Details (indicate cue type and reason): educated pt on hemi dressing technique, first in last out for LUE Lower Body Dressing: Sitting/lateral leans;Set up Lower Body Dressing Details (indicate cue type and reason): doffed/don L sock using figure four technique Toilet Transfer: Stand-pivot;BSC/3in1;Contact guard assist   Toileting- Clothing Manipulation and Hygiene: Sitting/lateral lean;Set up Toileting - Clothing Manipulation Details (indicate cue type and reason): self pericare       General ADL Comments: Reinforced AROM of shoulder, wrist and fingers     Vision         Perception         Praxis         Pertinent Vitals/Pain Pain Assessment Pain Assessment: 0-10 Pain Score: 8  Pain Location: L UE Pain Descriptors / Indicators: Aching, Discomfort Pain Intervention(s): Limited activity within patient's tolerance, Monitored during session, Repositioned  Extremity/Trunk Assessment Upper Extremity Assessment Upper Extremity Assessment: Right hand dominant;LUE deficits/detail LUE Deficits / Details: Bulky dressing limiting elbow ROM. Shoulder ROM WFL. MMT deferred due to precautions   Lower Extremity Assessment Lower Extremity Assessment: Overall  WFL for tasks assessed   Cervical / Trunk Assessment Cervical / Trunk Assessment: Normal   Communication Communication Communication: No apparent difficulties Cueing Techniques: Verbal cues   Cognition Arousal: Alert Behavior During Therapy: WFL for tasks assessed/performed Overall Cognitive Status: Within Functional Limits for tasks assessed                                       General Comments  MD reports elbow ROM is fine, it is just limited by the bulky dressing. No sling noted to be in room    Exercises General Exercises - Upper Extremity Shoulder Flexion: AROM, Left, 5 reps, Seated Shoulder Horizontal ABduction: AROM, Left Shoulder Horizontal ADduction: AROM, Left Elbow Flexion: Limitations Elbow Flexion Limitations: only able to get ~10 PROM due to dressing Hand Exercises Opposition: AROM, Seated   Shoulder Instructions      Home Living Family/patient expects to be discharged to:: Private residence Living Arrangements: Non-relatives/Friends Available Help at Discharge: Available 24 hours/day;Friend(s) (father uses WC, unable to help physically. Will d/c at friends house) Type of Home: House Home Access: Stairs to enter [employer] of Steps: 1 Entrance Stairs-Rails: None Home Layout: One level     Bathroom Shower/Tub: Psychologist, counselling (walkin with step up)   Bathroom Toilet: Standard Bathroom Accessibility: Yes   Home Equipment: None   Additional Comments: works as a Conservation officer, nature at [employer]      Prior Functioning/Environment Prior Level of Function : Needs assist;Working/employed;History of Falls (last six months)       Physical Assist : Mobility (physical) Mobility (physical): Stairs   Mobility Comments: needs help for stairs, reports falls and unsteadiness for years. Has not had PT and is not interested ADLs Comments: independent with ADLs        OT Problem List: Impaired balance (sitting and/or standing);Pain;Impaired  UE functional use      OT Treatment/Interventions: Self-care/ADL training;Balance training;Therapeutic exercise;Therapeutic activities;Patient/family education    OT Goals(Current goals can be found in the care plan section) Acute Rehab OT Goals Patient Stated Goal: To go home OT Goal Formulation: With patient Time For Goal Achievement: 10/24/23 Potential to Achieve Goals: Good ADL Goals Pt Will Perform Grooming: with modified independence;standing Pt Will Perform Lower Body Bathing: with modified independence;sit to/from stand Pt Will Perform Upper Body Dressing: with modified independence;sitting Pt Will Perform Lower Body Dressing: with modified independence;sitting/lateral leans Pt/caregiver will Perform Home Exercise Program: Increased ROM;Left upper extremity;Independently;With written HEP provided  OT Frequency: Min 1X/week    Co-evaluation              AM-PAC OT "6 Clicks" Daily Activity     Outcome Measure Help from another person eating meals?: None Help from another person taking care of personal grooming?: A Little Help from another person toileting, which includes using toliet, bedpan, or urinal?: A Little Help from another person bathing (including washing, rinsing, drying)?: A Little Help from another person to put on and taking off regular upper body clothing?: A Little Help from another person to put on and taking off regular lower body clothing?: A Little 6 Click Score: 19   End of Session Nurse Communication: Mobility status  Activity  Tolerance: Patient tolerated treatment well Patient left: in chair;with call bell/phone within reach;with chair alarm set  OT Visit Diagnosis: Pain;Unsteadiness on feet (R26.81) Pain - Right/Left: Left Pain - part of body: Arm                Time: 1610-9604 OT Time Calculation (min): 32 min Charges:  OT General Charges $OT Visit: 1 Visit OT Evaluation $OT Eval Moderate Complexity: 1 Mod OT Treatments $Self Care/Home  Management : 8-22 mins  10/10/2023  AB, OTR/L  Acute Rehabilitation Services  Office: [phone]   Tristan Schroeder 10/10/2023, 2:22 PM

## 2023-10-10 NOTE — Plan of Care (Signed)

## 2023-10-11 ENCOUNTER — Other Ambulatory Visit (HOSPITAL_COMMUNITY): Payer: Self-pay

## 2023-10-11 DIAGNOSIS — Z23 Encounter for immunization: Secondary | ICD-10-CM | POA: Diagnosis not present

## 2023-10-11 DIAGNOSIS — E559 Vitamin D deficiency, unspecified: Secondary | ICD-10-CM | POA: Diagnosis not present

## 2023-10-11 DIAGNOSIS — W19XXXD Unspecified fall, subsequent encounter: Secondary | ICD-10-CM | POA: Diagnosis not present

## 2023-10-11 DIAGNOSIS — S52132K Displaced fracture of neck of left radius, subsequent encounter for closed fracture with nonunion: Secondary | ICD-10-CM | POA: Diagnosis not present

## 2023-10-11 DIAGNOSIS — T84123A Displacement of internal fixation device of bone of left forearm, initial encounter: Secondary | ICD-10-CM | POA: Diagnosis not present

## 2023-10-11 DIAGNOSIS — S52002K Unspecified fracture of upper end of left ulna, subsequent encounter for closed fracture with nonunion: Secondary | ICD-10-CM | POA: Diagnosis not present

## 2023-10-11 DIAGNOSIS — S52133A Displaced fracture of neck of unspecified radius, initial encounter for closed fracture: Secondary | ICD-10-CM | POA: Diagnosis not present

## 2023-10-11 DIAGNOSIS — R296 Repeated falls: Secondary | ICD-10-CM | POA: Diagnosis not present

## 2023-10-11 DIAGNOSIS — G119 Hereditary ataxia, unspecified: Secondary | ICD-10-CM | POA: Diagnosis not present

## 2023-10-11 DIAGNOSIS — S52202K Unspecified fracture of shaft of left ulna, subsequent encounter for closed fracture with nonunion: Secondary | ICD-10-CM | POA: Diagnosis not present

## 2023-10-11 DIAGNOSIS — R269 Unspecified abnormalities of gait and mobility: Secondary | ICD-10-CM | POA: Diagnosis not present

## 2023-10-11 MED ORDER — ACETAMINOPHEN 500 MG PO TABS
500.0000 mg | ORAL_TABLET | Freq: Three times a day (TID) | ORAL | 0 refills | Status: AC | PRN
Start: 2023-10-11 — End: ?
  Filled 2023-10-11: qty 60, 10d supply, fill #0

## 2023-10-11 MED ORDER — METHOCARBAMOL 500 MG PO TABS
500.0000 mg | ORAL_TABLET | Freq: Four times a day (QID) | ORAL | 0 refills | Status: AC | PRN
Start: 2023-10-11 — End: ?
  Filled 2023-10-11: qty 30, 8d supply, fill #0

## 2023-10-11 MED ORDER — OXYCODONE HCL 5 MG PO TABS
5.0000 mg | ORAL_TABLET | Freq: Four times a day (QID) | ORAL | 0 refills | Status: AC | PRN
Start: 2023-10-11 — End: ?
  Filled 2023-10-11: qty 40, 7d supply, fill #0

## 2023-10-11 NOTE — Discharge Instructions (Signed)
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  No lifting anything heavier than about 5 pounds with your left arm.  RANGE OF MOTION/ACTIVITY: No motion restrictions with respect to your left leg or your left upper extremity.  Slowly increase activity  Bone health:   Review the following resource for additional information regarding bone health  [email or web address][employer]cy  Wound Care: Daily wound care starting on 10/13/2023.  See below   Discharge Wound Care Instructions  Do NOT apply any ointments, solutions or lotions to pin sites or surgical wounds.  These prevent needed drainage and even though solutions like hydrogen peroxide kill bacteria, they also damage cells lining the pin sites that help fight infection.  Applying lotions or ointments can keep the wounds moist and can cause them to breakdown and open up as well. This can increase the risk for infection. When in doubt call the office.  Surgical incisions should be dressed daily starting on 10/13/2023.  Once there is no drainage the wounds can be left open to the air.  If any drainage is noted, use one layer of adaptic or Mepitel, then gauze, Kerlix, and an ace wrap for your left arm.  For your left hip you can use 4 x 4 gauze and tape or a Mepilex which is a silicone foam dressing which is which you currently have on  [email or web address] [email or web address]://dennis-soto.com/?pd_rd_i=[email or web address]&th=1  [email or web address].[email or web address]/  These dressing supplies should be available at local medical  supply stores ([employer], [facility] [employer], etc). They are not usually carried at places like [facility], [facility], [facility], etc  Once the incision is completely dry and without drainage, it may be left open to air out.  Showering may begin 36-48 hours later.  Cleaning gently with soap and water.   Diet: as you were eating previously.  Can use over the counter stool softeners and bowel preparations, such as Miralax, to help with bowel movements.  Narcotics can be constipating.  Be sure to drink plenty of fluids  PAIN MEDICATION USE AND EXPECTATIONS  You have likely been given narcotic medications to help control your pain.  After a traumatic event that results in an fracture (broken bone) with or without surgery, it is ok to use narcotic pain medications to help control one's pain.  We understand that everyone responds to pain differently and each individual patient will be evaluated on a regular basis for the continued need for narcotic medications. Ideally, narcotic medication use should last no more than 6-8 weeks (coinciding with fracture healing).   As a patient it is your responsibility as well to monitor narcotic medication use and report the amount and frequency you use these medications when you come to your office visit.   We would also advise that if you are using narcotic medications, you should take a dose prior to therapy to maximize you participation.  IF YOU ARE ON NARCOTIC MEDICATIONS IT IS NOT PERMISSIBLE TO OPERATE A MOTOR VEHICLE (MOTORCYCLE/CAR/TRUCK/MOPED) OR HEAVY MACHINERY DO NOT MIX NARCOTICS WITH OTHER CNS (CENTRAL NERVOUS SYSTEM) DEPRESSANTS SUCH AS ALCOHOL   POST-OPERATIVE OPIOID TAPER INSTRUCTIONS: It is important to wean off of your opioid medication as soon as possible. If you do not need pain medication after your surgery it is ok to stop day one. Opioids include: Codeine,  Hydrocodone(Norco, Vicodin), Oxycodone(Percocet, oxycontin) and hydromorphone amongst others.   Long term and even short term use of opiods can cause: Increased pain response Dependence Constipation Depression Respiratory depression And more.  Withdrawal symptoms can include Flu like symptoms Nausea, vomiting And more Techniques to manage these symptoms Hydrate well Eat regular healthy meals Stay active Use relaxation techniques(deep breathing, meditating, yoga) Do Not substitute Alcohol to help with tapering If you have been on opioids for less than two weeks and do not have pain than it is ok to stop all together.  Plan to wean off of opioids This plan should start within one week post op of your fracture surgery  Maintain the same interval or time between taking each dose and first decrease the dose.  Cut the total daily intake of opioids by one tablet each day Next start to increase the time between doses. The last dose that should be eliminated is the evening dose.    STOP SMOKING OR USING NICOTINE PRODUCTS!!!!  As discussed nicotine severely impairs your body's ability to heal surgical and traumatic wounds but also impairs bone healing.  Wounds and bone heal by forming microscopic blood vessels (angiogenesis) and nicotine is a vasoconstrictor (essentially, shrinks blood vessels).  Therefore, if vasoconstriction occurs to these microscopic blood vessels they essentially disappear and are unable to deliver necessary nutrients to the healing tissue.  This is one modifiable factor that you can do to dramatically increase your chances of healing your injury.    (This means no smoking, no nicotine gum, patches, etc)  DO NOT USE NONSTEROIDAL ANTI-INFLAMMATORY DRUGS (NSAID'S)  Using products such as Advil (ibuprofen), Aleve (naproxen), Motrin (ibuprofen) for additional pain control during fracture healing can delay and/or prevent the healing response.  If you would like to take over the counter (OTC) medication, Tylenol (acetaminophen) is ok.  However, some narcotic medications  that are given for pain control contain acetaminophen as well. Therefore, you should not exceed more than 4000 mg of tylenol in a day if you do not have liver disease.  Also note that there are may OTC medicines, such as cold medicines and allergy medicines that my contain tylenol as well.  If you have any questions about medications and/or interactions please ask your doctor/PA or your pharmacist.      ICE AND ELEVATE INJURED/OPERATIVE EXTREMITY  Using ice and elevating the injured extremity above your heart can help with swelling and pain control.  Icing in a pulsatile fashion, such as 20 minutes on and 20 minutes off, can be followed.    Do not place ice directly on skin. Make sure there is a barrier between to skin and the ice pack.    Using frozen items such as frozen peas works well as the conform nicely to the are that needs to be iced.  USE AN ACE WRAP OR TED HOSE FOR SWELLING CONTROL  In addition to icing and elevation, Ace wraps or TED hose are used to help limit and resolve swelling.  It is recommended to use Ace wraps or TED hose until you are informed to stop.    When using Ace Wraps start the wrapping distally (farthest away from the body) and wrap proximally (closer to the body)   Example: If you had surgery on your leg or thing and you do not have a splint on, start the ace wrap at the toes and work your way up to the thigh        If you had  surgery on your upper extremity and do not have a splint on, start the ace wrap at your fingers and work your way up to the upper arm  IF YOU ARE IN A SPLINT OR CAST DO NOT REMOVE IT FOR ANY REASON   If your splint gets wet for any reason please contact the office immediately. You may shower in your splint or cast as long as you keep it dry.  This can be done by wrapping in a cast cover or garbage back (or similar)  Do Not stick any thing down your splint or cast such as pencils, money, or hangers to try and scratch yourself with.  If you feel  itchy take benadryl as prescribed on the bottle for itching  IF YOU ARE IN A CAM BOOT (BLACK BOOT)  You may remove boot periodically. Perform daily dressing changes as noted below.  Wash the liner of the boot regularly and wear a sock when wearing the boot. It is recommended that you sleep in the boot until told otherwise    Call office for the following: Temperature greater than 101F Persistent nausea and vomiting Severe uncontrolled pain Redness, tenderness, or signs of infection (pain, swelling, redness, odor or green/yellow discharge around the site) Difficulty breathing, headache or visual disturbances Hives Persistent dizziness or light-headedness Extreme fatigue Any other questions or concerns you may have after discharge  In an emergency, call 911 or go to an Emergency Department at a nearby hospital  HELPFUL INFORMATION  If you had a block, it will wear off between 8-24 hrs postop typically.  This is period when your pain may go from nearly zero to the pain you would have had postop without the block.  This is an abrupt transition but nothing dangerous is happening.  You may take an extra dose of narcotic when this happens.  You should wean off your narcotic medicines as soon as you are able.  Most patients will be off or using minimal narcotics before their first postop appointment.   We suggest you use the pain medication the first night prior to going to bed, in order to ease any pain when the anesthesia wears off. You should avoid taking pain medications on an empty stomach as it will make you nauseous.  Do not drink alcoholic beverages or take illicit drugs when taking pain medications.  In most states it is against the law to drive while you are in a splint or sling.  And certainly against the law to drive while taking narcotics.  You may return to work/school in the next couple of days when you feel up to it.   Pain medication may make you constipated.  Below are a  few solutions to try in this order: Decrease the amount of pain medication if you aren't having pain. Drink lots of decaffeinated fluids. Drink prune juice and/or each dried prunes  If the first 3 don't work start with additional solutions Take Colace - an over-the-counter stool softener Take Senokot - an over-the-counter laxative Take Miralax - a stronger over-the-counter laxative     CALL THE OFFICE WITH ANY QUESTIONS OR CONCERNS: [phone]   VISIT OUR WEBSITE FOR ADDITIONAL INFORMATION: [email or web address]

## 2023-10-11 NOTE — Plan of Care (Signed)
  Problem: Education: Goal: Knowledge of General Education information will improve Description Including pain rating scale, medication(s)/side effects and non-pharmacologic comfort measures Outcome: Progressing   Problem: Health Behavior/Discharge Planning: Goal: Ability to manage health-related needs will improve Outcome: Progressing   Problem: Clinical Measurements: Goal: Ability to maintain clinical measurements within normal limits will improve Outcome: Progressing Goal: Will remain free from infection Outcome: Progressing Goal: Diagnostic test results will improve Outcome: Progressing   Problem: Activity: Goal: Risk for activity intolerance will decrease Outcome: Progressing   Problem: Nutrition: Goal: Adequate nutrition will be maintained Outcome: Progressing   Problem: Coping: Goal: Level of anxiety will decrease Outcome: Progressing   Problem: Safety: Goal: Ability to remain free from injury will improve Outcome: Progressing   Problem: Skin Integrity: Goal: Risk for impaired skin integrity will decrease Outcome: Progressing

## 2023-10-11 NOTE — TOC Transition Note (Signed)
Transition of Care [facility]) - CM/SW Discharge Note   Patient Details  Name: Karla Nichols MRN: [id number] Date of Birth: 1986/01/09  [facility] CM/SW Contact:  Epifanio Lesches, RN Phone Number: 10/11/2023, 12:48 PM   Clinical Narrative:     Patient will DC to: home Anticipated DC date: 10/11/2023 Family notified: yes Transport by: car  Per MD patient ready for DC today .RN, patient, and patient's dad  notified of DC.  Referral for platform RW made with [employer]. Equipment wil be delivered to bedside prior to d/c. Pt without Rx MED concerns.TOC pharmacy to provide RX meds. Post hospital f/u noted on AVS. Dad to provide transportation to home.  RNCM will sign off for now as intervention is no longer needed. Please consult [place] again if new needs arise.      Final next level of care: Home/Self Care Barriers to Discharge: No Barriers Identified   Patient Goals and CMS Choice   Choice offered to / list presented to : Patient  Discharge Placement                         Discharge Plan and Services Additional resources added to the After Visit Summary for                  DME Arranged: Dan Humphreys platform DME Agency: [employer] Date DME Agency Contacted: 10/11/23 Time DME Agency Contacted: [id number] Representative spoke with at DME Agency: Ian Malkin            Social Determinants of Health (SDOH) Interventions SDOH Screenings   Food Insecurity: No Food Insecurity (10/09/2023)  Housing: Low Risk  (10/09/2023)  Transportation Needs: No Transportation Needs (10/09/2023)  Utilities: Not At Risk (10/09/2023)  Tobacco Use: Low Risk  (10/09/2023)     Readmission Risk Interventions     No data to display

## 2023-10-11 NOTE — Progress Notes (Signed)
Physical Therapy Treatment Patient Details Name: Karla Nichols MRN: [id number] DOB: 03/05/1986 Today's Date: 10/11/2023   History of Present Illness 37 y.o. 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 PMHx: cerebellar ataxia, prior ORIF ulnar fx in 2017 and 2018.    PT Comments  Pt in bed upon arrival and agreeable to PT session. Worked on safety with using L platform walker to ambulate and to ascend/descend steps in today's session. Pt with increased stability ambulating with L platform walker ~40 ft in today's session. Educated pt on how to negotiate steps with a L platform walker with steps that can fit a RW and smaller steps. Pt was given two handouts on different techniques. Pt required MinA for stair negotiation to stabilize the RW and for mild unsteadiness. Pt will have 24/7 physical assistance at home and +2 individuals to help pt ascend/descend steps. Pt feels safe returning home with current support system. Pt is progressing well towards goals. Acute PT to follow.      If plan is discharge home, recommend the following: A little help with walking and/or transfers;A little help with bathing/dressing/bathroom;Assistance with cooking/housework;Help with stairs or ramp for entrance;Assist for transportation   Can travel by private vehicle      Yes  Equipment Recommendations  Other (comment) (L platform walker)       Precautions / Restrictions Precautions Precautions: Fall Required Braces or Orthoses:  (No UE sling in room) Restrictions Weight Bearing Restrictions: Yes LUE Weight Bearing: Weight bear through elbow only (while using platform walker per Montez Morita, PA-C)     Mobility  Bed Mobility Overal bed mobility: Needs Assistance Bed Mobility: Supine to Sit     Supine to sit: Supervision, HOB elevated     Transfers Overall transfer level: Needs assistance Equipment used: 1 person hand held assist, Left platform  walker Transfers: Sit to/from Stand, Bed to chair/wheelchair/BSC Sit to Stand: Contact guard assist    General transfer comment: CGA for safety and steadying, used L platform walker to stand and ambulate in the room. [facility]HH to transfer to and from [facility]. Cues for hand placement with L platform walker    Ambulation/Gait Ambulation/Gait assistance: Contact guard assist Gait Distance (Feet): 40 Feet Assistive device: Left platform walker Gait Pattern/deviations: Step-through pattern, Decreased step length - left, Decreased stance time - left Gait velocity: dec     General Gait Details: decreased WB on L LE, slightly unsteady requiring CGA.   Stairs Stairs: Yes Stairs assistance: Min assist Stair Management: No rails, Step to pattern, Forwards, Backwards (w/ L platform RW) Number of Stairs: 2 General stair comments: practiced stair negotiation w/ RW fitting flat on step, and with using RW with small steps. MinA for unsteadiness and to hold RW still. Gave pt handouts and discussed safety. Pt will have +2 help to get into the house.        Balance Overall balance assessment: Needs assistance, History of Falls Sitting-balance support: No upper extremity supported, Feet supported Sitting balance-Leahy Scale: Good     Standing balance support: Single extremity supported, During functional activity, Reliant on assistive device for balance Standing balance-Leahy Scale: Poor Standing balance comment: reliant on UE support for balance in standing       Cognition Arousal: Alert Behavior During Therapy: WFL for tasks assessed/performed Overall Cognitive Status: Within Functional Limits for tasks assessed        General Comments General comments (skin integrity, edema, etc.): VSS on RA,  no sling in room      Pertinent Vitals/Pain Pain Assessment Pain Assessment: Faces Faces Pain Scale: Hurts a little bit Pain Location: L hip with WB Pain Descriptors / Indicators: Aching,  Discomfort Pain Intervention(s): Limited activity within patient's tolerance, Monitored during session, Repositioned           PT Goals (current goals can now be found in the care plan section) Acute Rehab PT Goals PT Goal Formulation: With patient Time For Goal Achievement: 10/24/23 Potential to Achieve Goals: Good Progress towards PT goals: Progressing toward goals    Frequency    Min 1X/week       AM-PAC PT "6 Clicks" Mobility   Outcome Measure  Help needed turning from your back to your side while in a flat bed without using bedrails?: None Help needed moving from lying on your back to sitting on the side of a flat bed without using bedrails?: None Help needed moving to and from a bed to a chair (including a wheelchair)?: A Little Help needed standing up from a chair using your arms (e.g., wheelchair or bedside chair)?: A Little Help needed to walk in hospital room?: A Little Help needed climbing 3-5 steps with a railing? : A Lot 6 Click Score: 19    End of Session Equipment Utilized During Treatment: Gait belt Activity Tolerance: Patient tolerated treatment well Patient left: in chair;with call bell/phone within reach Nurse Communication: Mobility status PT Visit Diagnosis: Unsteadiness on feet (R26.81);Repeated falls (R29.6)     Time: 1040-1108 PT Time Calculation (min) (ACUTE ONLY): 28 min  Charges:    $Gait Training: 8-22 mins $Therapeutic Activity: 8-22 mins PT General Charges $$ ACUTE PT VISIT: 1 Visit                     Hilton Cork, PT, DPT Secure Chat Preferred  Rehab Office [phone]   Arturo Morton Brion Aliment 10/11/2023, 11:58 AM

## 2023-10-11 NOTE — Discharge Summary (Cosign Needed)
Orthopaedic Trauma Service (OTS) Discharge Summary   Patient ID: Karla Nichols MRN: [id number] DOB/AGE: 1986/05/01 37 y.o.  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 Carola Frost Removal of hardware left ulna Repair of left ulnar nonunion Autografting with left iliac crest bone graft  Discharged Condition: good  Hospital Course:   Patient is a 37 year old female with history of multiple fractures to her left upper extremity including open fracture.  She has significant gait abnormality as well which causes her to fall regularly.  Patient was referred to our office for evaluation of left ulnar nonunion.  Risks and benefits of surgery reviewed with the patient and she wished to proceed.  Patient was taken to the operating room on 10/09/2023 for the procedure as noted above.  All hardware was removed.  The nonunion site was debrided.  Cultures were sent which still showed no growth to date.  No purulence or other signs of infection were encountered.  The nonunion site was grafted with left iliac crest bone graft and new plate was placed.  Patient tolerated surgery well.  She was admitted overnight for pain control and therapies.  We initially anticipated proceeding with debridement of her nonunion site with placement of antibiotic spacer for induced membrane technique however given the overall appearance we proceeded  with definitive fixation and as such we felt that overnight stay was appropriate for pain control and therapies.  Patient's hospital stay was really unremarkable however she did have some issues with mobilizing due to soreness of her left hip at the site of her bone graft harvest.  This combined with her baseline gait abnormality was felt that further stay in the hospital is warranted.  Pain was managed primarily with oral pain medications.  Over the neck several days she continued to progress well.  She mobilize well with therapies and did well with upper extremity range of motion exercises.  On postop day 3 she was deemed stable for discharge to home with her boyfriend.  Patient discharged in stable condition.  At the time of dictation her cultures have demonstrated no growth.  She will not be sent out on any antibiotics.  She will follow-up with orthopedics in 10 to 14 days.  Wound care was reviewed with her prior to discharge.  Her medications were sent to the [facility] pharmacy.  She does not require pharmacologic DVT prophylaxis.  She was placed on Ancef for prophylactic antibiotic coverage perioperatively.  Labs did show some vitamin D insufficiency and she was started on appropriate supplementation as well  Of note her preoperative inflammatory markers noted a sed rate of 4 mm/hr and a CRP less than 0.5 mg/dL  Consults: None  Significant Diagnostic Studies: labs:    Latest Reference Range & Units 10/09/23 20:20 10/10/23 07:32  Sodium 135 - 145 mmol/L 137   Potassium 3.5 - 5.1 mmol/L 4.2   Chloride 98 - 111 mmol/L 106   CO2  22 - 32 mmol/L 23   Glucose 70 - 99 mg/dL 409 (H)   BUN 6 - 20 mg/dL 11   Creatinine 8.11 - 1.00 mg/dL 9.14   Calcium 8.9 - 78.2 mg/dL 8.3 (L)   Anion gap 5 - 15  8   Alkaline Phosphatase 38 - 126 U/L 43   Albumin 3.5 - 5.0 g/dL 3.5   AST 15 - 41 U/L 23   ALT 0 - 44 U/L 16   Total Protein 6.5 - 8.1 g/dL 6.2 (L)   Total Bilirubin <1.2 mg/dL 1.2 (H)   GFR, Estimated >60  mL/min >60   WBC 4.0 - 10.5 K/uL 12.3 (H) 9.2  RBC 3.87 - 5.11 MIL/uL 3.97 3.96  Hemoglobin 12.0 - 15.0 g/dL 95.6 (L) 21.3 (L)  HCT 36.0 - 46.0 % 32.1 (L) 32.5 (L)  MCV 80.0 - 100.0 fL 80.9 82.1  MCH 26.0 - 34.0 pg 25.9 (L) 25.8 (L)  MCHC 30.0 - 36.0 g/dL 08.6 57.8  RDW 46.9 - 62.9 % 14.3 14.5  Platelets 150 - 400 K/uL 255 214  nRBC 0.0 - 0.2 % 0.0 0.0  (H): Data is abnormally high (L): Data is abnormally low   Latest Reference Range & Units 10/09/23 06:42  Vitamin D, 25-Hydroxy 30 - 100 ng/mL 27.92 (L)  (L): Data is abnormally low   Latest Reference Range & Units 10/09/23 06:42  Sed Rate 0 - 22 mm/hr 4    Latest Reference Range & Units 10/09/23 06:42  CRP <1.0 mg/dL <5.2    Treatments: IV hydration, antibiotics: Ancef, analgesia: acetaminophen, Dilaudid, and oxycodone, therapies: PT, OT, and RN, and surgery: As above  Discharge Exam:      Orthopaedic Trauma Service Progress Note   Patient ID: Karla Nichols MRN: [id number] DOB/AGE: 12/10/1985 37 y.o.   Subjective:   No new issues  Ready for dc  Boyfriend coming this afternoon   ROS As above   Objective:    VITALS:         Vitals:    10/11/23 1545 10/11/23 2000 10/12/23 0525 10/12/23 0722  BP: 123/68 133/82 (!) 126/91 118/84  Pulse: 100 (!) 110 94 97  Resp: 16 18 16 16   Temp: 98.7 F (37.1 C) 99.3 F (37.4 C) 98.9 F (37.2 C) 98.3 F (36.8 C)  TempSrc: Oral Oral Oral Oral  SpO2: 98% 97% 98% 97%  Weight:          Height:              Estimated body mass index is 26.39 kg/m as calculated from the following:   Height as of this encounter: 5\' 3"  (1.6 m).   Weight as of this encounter: 67.6 kg.     Intake/Output      11/21 0701 11/22 0700 11/22 0701 11/23 0700   P.O. 480    Total Intake(mL/kg) 480 (7.1)    Urine (mL/kg/hr) 400 (0.2)    Total Output 400    Net +80         Urine Occurrence 2 x       LABS   Lab Results Last 24 Hours  No results found for this or any previous visit (from the  past 24 hour(s)).       PHYSICAL EXAM:    Gen: Sitting in bedside chair with legs crossed under her, pleasant, looks much better Lungs: Unlabored Cardiac: Regular Ext:       Left upper extremity  Dressing is clean, dry and intact             Extremity is warm             Capillary refill             Radial, ulnar, median nerve motor and sensory functions intact.  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   -Left ulnar nonunion s/p removal of hardware, autografting with left iliac crest bone graft, placement of new ulnar plate Weightbearing No lifting with left upper extremity Try to limit axial loading on left forearm               ROM/Activity                         As above                         Digit and wrist motion as tolerated                          Elbow motion as tolerated                Wound care                         Dressing changes as needed starting tomorrow, reviewed with pt                                                 Mepilex, 4x4 gauze and ace wrap                                                 Leave open to air once dry and may clean with soap and water once there is no drainage                Continue with therapies   - Pain management:             Multimodal   - Medical issues              Gait disturbance  Increases risk of falls   - ID:              Perioperative antibiotics                          Intraoperative cultures showed no growth to date     - Dispo:             Home this afternoon             Follow-up with orthopedics in 2 weeks             Meds sent to [facility] pharmacy     Disposition: Discharge disposition: 01-Home or Self Care       Discharge Instructions     Call MD / Call 911   Complete by: As directed    If you experience chest pain or shortness of breath, CALL 911 and be transported to the hospital emergency room.  If you develope a fever above 101 F, pus (white drainage) or increased drainage or redness at the wound, or calf pain, call your surgeon's office.   Constipation Prevention   Complete by: As directed    Drink plenty of fluids.  Prune juice may be helpful.  You may use a stool softener, such as Colace (over the counter) 100 mg twice a day.  Use MiraLax (over the counter) for constipation as needed.   Diet - low sodium heart healthy   Complete by: As directed    Discharge instructions   Complete by: As directed    Orthopaedic Trauma Service Discharge Instructions   General Discharge Instructions  Orthopaedic Injuries:  Left ulnar nonunion treated with open reduction internal fixation using plate and screws as well as bone grafting from your left iliac crest  WEIGHT BEARING STATUS: Weight-bear as tolerated left leg.  No lifting anything heavier than about 5 pounds with your left arm.  RANGE OF MOTION/ACTIVITY: No motion restrictions with respect to your left leg or your left upper extremity.  Slowly increase activity  Bone health:   Review the following resource for additional information regarding bone health  [email or web address].com[employer]cy  Wound Care: Daily wound care starting on 10/13/2023.  See below   Discharge Wound Care Instructions  Do NOT apply any ointments, solutions or lotions to pin sites or surgical wounds.   These prevent needed drainage and even though solutions like hydrogen peroxide kill bacteria, they also damage cells lining the pin sites that help fight infection.  Applying lotions or ointments can keep the wounds moist and can cause them to breakdown and open up as well. This can increase the risk for infection. When in doubt call the office.  Surgical incisions should be dressed daily starting on 10/13/2023.  Once there is no drainage the wounds can be left open to the air.  If any drainage is noted, use one layer of adaptic or Mepitel, then gauze, Kerlix, and an ace wrap for your left arm.  For your left hip you can use 4 x 4 gauze and tape or a Mepilex which is a silicone foam dressing which is which you currently have on  [employer]cz [email or web address]://dennis-soto.com/?pd_rd_i=[email or web address]&th=1  http://rojas.com/  These dressing supplies should be available at local medical supply stores ([employer], [employer], etc). They are not usually carried at places like [facility], [facility], [employer], etc  Once the incision is completely dry and without drainage, it may be left open to air out.  Showering may begin  36-48 hours later.  Cleaning gently with soap and water.   Diet: as you were eating previously.  Can use over the counter stool softeners and bowel preparations, such as Miralax, to help with bowel movements.  Narcotics can be constipating.  Be sure to drink plenty of fluids  PAIN MEDICATION USE AND EXPECTATIONS  You have likely been given narcotic medications to help control your pain.  After a traumatic event that results in an fracture (broken bone) with or without surgery, it is ok to use narcotic pain medications to help control one's pain.  We understand that everyone responds to pain differently and  each individual patient will be evaluated on a regular basis for the continued need for narcotic medications. Ideally, narcotic medication use should last no more than 6-8 weeks (coinciding with fracture healing).   As a patient it is your responsibility as well to monitor narcotic medication use and report the amount and frequency you use these medications when you come to your office visit.   We would also advise that if you are using narcotic medications, you should take a dose prior to therapy to maximize you participation.  IF YOU ARE ON NARCOTIC MEDICATIONS IT IS NOT PERMISSIBLE TO OPERATE A MOTOR VEHICLE (MOTORCYCLE/CAR/TRUCK/MOPED) OR HEAVY MACHINERY DO NOT MIX NARCOTICS WITH OTHER CNS (CENTRAL NERVOUS SYSTEM) DEPRESSANTS SUCH AS ALCOHOL   POST-OPERATIVE OPIOID TAPER INSTRUCTIONS:  It is important to wean off of your opioid medication as soon as possible. If you do not need pain medication after your surgery it is ok to stop day one.  Opioids include:  o Codeine, Hydrocodone(Norco, Vicodin), Oxycodone(Percocet, oxycontin) and hydromorphone amongst others.   Long term and even short term use of opiods can cause:  o Increased pain response  o Dependence  o Constipation  o Depression  o Respiratory depression  o And more.   Withdrawal symptoms can include  o Flu like symptoms  o Nausea, vomiting  o And more  Techniques to manage these symptoms  o Hydrate well  o Eat regular healthy meals  o Stay active  o Use relaxation techniques(deep breathing, meditating, yoga)  Do Not substitute Alcohol to help with tapering  If you have been on opioids for less than two weeks and do not have pain than it is ok to stop all together.   Plan to wean off of opioids  o This plan should start within one week post op of your fracture surgery   o Maintain the same interval or time between taking each dose and first decrease the dose.   o Cut the total daily intake of opioids by one tablet  each day  o Next start to increase the time between doses.  o The last dose that should be eliminated is the evening dose.    STOP SMOKING OR USING NICOTINE PRODUCTS!!!!  As discussed nicotine severely impairs your body's ability to heal surgical and traumatic wounds but also impairs bone healing.  Wounds and bone heal by forming microscopic blood vessels (angiogenesis) and nicotine is a vasoconstrictor (essentially, shrinks blood vessels).  Therefore, if vasoconstriction occurs to these microscopic blood vessels they essentially disappear and are unable to deliver necessary nutrients to the healing tissue.  This is one modifiable factor that you can do to dramatically increase your chances of healing your injury.    (This means no smoking, no nicotine gum, patches, etc)  DO NOT USE NONSTEROIDAL ANTI-INFLAMMATORY DRUGS (NSAID'S)  Using products such as Advil (ibuprofen),  Aleve (naproxen), Motrin (ibuprofen) for additional pain control during fracture healing can delay and/or prevent the healing response.  If you would like to take over the counter (OTC) medication, Tylenol (acetaminophen) is ok.  However, some narcotic medications that are given for pain control contain acetaminophen as well. Therefore, you should not exceed more than 4000 mg of tylenol in a day if you do not have liver disease.  Also note that there are may OTC medicines, such as cold medicines and allergy medicines that my contain tylenol as well.  If you have any questions about medications and/or interactions please ask your doctor/PA or your pharmacist.      ICE AND ELEVATE INJURED/OPERATIVE EXTREMITY  Using ice and elevating the injured extremity above your heart can help with swelling and pain control.  Icing in a pulsatile fashion, such as 20 minutes on and 20 minutes off, can be followed.    Do not place ice directly on skin. Make sure there is a barrier between to skin and the ice pack.    Using frozen items such as frozen  peas works well as the conform nicely to the are that needs to be iced.  USE AN ACE WRAP OR TED HOSE FOR SWELLING CONTROL  In addition to icing and elevation, Ace wraps or TED hose are used to help limit and resolve swelling.  It is recommended to use Ace wraps or TED hose until you are informed to stop.    When using Ace Wraps start the wrapping distally (farthest away from the body) and wrap proximally (closer to the body)   Example: If you had surgery on your leg or thing and you do not have a splint on, start the ace wrap at the toes and work your way up to the thigh        If you had surgery on your upper extremity and do not have a splint on, start the ace wrap at your fingers and work your way up to the upper arm  IF YOU ARE IN A SPLINT OR CAST DO NOT REMOVE IT FOR ANY REASON   If your splint gets wet for any reason please contact the office immediately. You may shower in your splint or cast as long as you keep it dry.  This can be done by wrapping in a cast cover or garbage back (or similar)  Do Not stick any thing down your splint or cast such as pencils, money, or hangers to try and scratch yourself with.  If you feel itchy take benadryl as prescribed on the bottle for itching  IF YOU ARE IN A CAM BOOT (BLACK BOOT)  You may remove boot periodically. Perform daily dressing changes as noted below.  Wash the liner of the boot regularly and wear a sock when wearing the boot. It is recommended that you sleep in the boot until told otherwise    Call office for the following: ? Temperature greater than 101F ? Persistent nausea and vomiting ? Severe uncontrolled pain ? Redness, tenderness, or signs of infection (pain, swelling, redness, odor or green/yellow discharge around the site) ? Difficulty breathing, headache or visual disturbances ? Hives ? Persistent dizziness or light-headedness ? Extreme fatigue ? Any other questions or concerns you may have after discharge  In an emergency,  call 911 or go to an Emergency Department at a nearby hospital  HELPFUL INFORMATION  ? If you had a block, it will wear off between 8-24 hrs postop typically.  This is period when your pain may go from nearly zero to the pain you would have had postop without the block.  This is an abrupt transition but nothing dangerous is happening.  You may take an extra dose of narcotic when this happens.  ? You should wean off your narcotic medicines as soon as you are able.  Most patients will be off or using minimal narcotics before their first postop appointment.   ? We suggest you use the pain medication the first night prior to going to bed, in order to ease any pain when the anesthesia wears off. You should avoid taking pain medications on an empty stomach as it will make you nauseous.  ? Do not drink alcoholic beverages or take illicit drugs when taking pain medications.  ? In most states it is against the law to drive while you are in a splint or sling.  And certainly against the law to drive while taking narcotics.  ? You may return to work/school in the next couple of days when you feel up to it.   ? Pain medication may make you constipated.  Below are a few solutions to try in this order:   ? Decrease the amount of pain medication if you aren't having pain.   ? Drink lots of decaffeinated fluids.   ? Drink prune juice and/or each dried prunes   o If the first 3 don't work start with additional solutions   ? Take Colace - an over-the-counter stool softener   ? Take Senokot - an over-the-counter laxative   ? Take Miralax - a stronger over-the-counter laxative     CALL THE OFFICE WITH ANY QUESTIONS OR CONCERNS: [phone]   VISIT OUR WEBSITE FOR ADDITIONAL INFORMATION: [email or web address]   Driving restrictions   Complete by: As directed    No driving until further notice   Increase activity slowly as tolerated   Complete by: As directed    Lifting restrictions   Complete by: As  directed    No lifting until further notice   Post-operative opioid taper instructions:   Complete by: As directed    POST-OPERATIVE OPIOID TAPER INSTRUCTIONS: It is important to wean off of your opioid medication as soon as possible. If you do not need pain medication after your surgery it is ok to stop day one. Opioids include: Codeine, Hydrocodone(Norco, Vicodin), Oxycodone(Percocet, oxycontin) and hydromorphone amongst others.  Long term and even short term use of opiods can cause: Increased pain response Dependence Constipation Depression Respiratory depression And more.  Withdrawal symptoms can include Flu like symptoms Nausea, vomiting And more Techniques to manage these symptoms Hydrate well Eat regular healthy meals Stay active Use relaxation techniques(deep breathing, meditating, yoga) Do Not substitute Alcohol to help with tapering If you have been on opioids for less than two weeks and do not have pain than it is ok to stop all together.  Plan to wean off of opioids This plan should start within one week post op of your joint replacement. Maintain the same interval or time between taking each dose and first decrease the dose.  Cut the total daily intake of opioids by one tablet each day Next start to increase the time between doses. The last dose that should be eliminated is the evening dose.         Allergies as of 10/12/2023   No Known Allergies      Medication List     STOP taking these medications  docusate sodium 100 MG capsule Commonly known as: COLACE   HYDROcodone-acetaminophen 5-325 MG tablet Commonly known as: NORCO/VICODIN   naproxen 375 MG tablet Commonly known as: NAPROSYN   naproxen sodium 220 MG tablet Commonly known as: ALEVE   oxyCODONE-acetaminophen 5-325 MG tablet Commonly known as: PERCOCET/ROXICET       TAKE these medications    Acetaminophen Extra Strength 500 MG Tabs Take 1-2 tablets (500-1,000 mg total) by  mouth every 8 (eight) hours as needed for mild pain (pain score 1-3) or moderate pain (pain score 4-6) (pain score 1-3 or temp > 100.5).   acidophilus Caps capsule Take 1 capsule by mouth daily.   CALTRATE 600+D3 PO Take 1 tablet by mouth daily.   cetirizine 10 MG tablet Commonly known as: ZYRTEC Take 10 mg by mouth daily.   methocarbamol 500 MG tablet Commonly known as: ROBAXIN Take 1 tablet (500 mg total) by mouth every 6 (six) hours as needed for muscle spasms. What changed: when to take this   oxyCODONE 5 MG immediate release tablet Commonly known as: Oxy IR/ROXICODONE Take 1-2 tablets (5-10 mg total) by mouth every 6 (six) hours as needed for severe pain (pain score 7-10) (pain score 7-10).               Durable Medical Equipment  (From admission, onward)           Start     Ordered   10/11/23 1210  For home use only DME Walker platform  Once       Question:  Patient needs a walker to treat with the following condition  Answer:  Gait abnormality   10/11/23 1210            Follow-up Information     Myrene Galas, MD. Schedule an appointment as soon as possible for a visit in 2 week(s).   Specialty: Orthopedic Surgery Contact information: [address] [place] [address] [phone]         Farris Has, MD Follow up.   Specialty: Family Medicine Contact information: [address]  [address] [place] [address] [phone]                  Signed:  [name], Cordelia Poche [phone] (C) 10/12/2023, 9:44 AM  [facility] [address] [place] [address] [phone] (F)

## 2023-10-11 NOTE — Progress Notes (Signed)
Occupational Therapy Treatment Patient Details Name: Karla Nichols MRN: [id number] DOB: [birth year] Today's Date: 10/11/2023   History of present illness 37 y.o. 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 PMHx: cerebellar ataxia, prior ORIF ulnar fx in 2017 and 2018.   OT comments  Reinforced education on LUE exercises and AROM/AAROM that pt should perform frequently to preserver strength and joint integrity. Discussed with pt compensatory strategies to maintain functional independence in ADLs, she declined efforts of attempting them so a detailed discussion was provided. Pt now able to supinate LUE to neutral and has more L elbow flexion after dressing was reduced. OT to continue to progress pt as able. DC plans remain appropriate for outpatient OT when able.       If plan is discharge home, recommend the following:  Assistance with cooking/housework;Assist for transportation   Equipment Recommendations  None recommended by OT    Recommendations for Other Services      Precautions / Restrictions Precautions Precautions: Fall Restrictions Weight Bearing Restrictions: Yes LUE Weight Bearing: Weight bear through elbow only Other Position/Activity Restrictions: while using platform walker per Montez Morita, PA-C       Mobility Bed Mobility               General bed mobility comments: up in recliner on arrival    Transfers                         Balance                                           ADL either performed or assessed with clinical judgement   ADL                     Upper Body Dressing Details (indicate cue type and reason): Pt return demonstrated understanding of first in last out technique to don UB garments, verbally stating the sequence of steps. Pt declined attempts to don bra(even with the offer of a female therapist), discussed buttoning the bra in the front then twisting  it around, using bralette, or front closure bra.                   General ADL Comments: Pt reports she has shower seat to use at home, discussed setup for reduced strain on R hip. Reviewed UE exercises and reassessed allowed ROM today following reduction in LUE dressing size. Discussed button hook if needed, but also educated pt on using LUE as more of a stabilizer when buttoning then using the R hand as the more forceful one to try and get buttons on.    Extremity/Trunk Assessment Upper Extremity Assessment LUE Deficits / Details: dressing not as bulky, AAROM elbow flexion of 100* wrist flex/ext WFL, able to complete wrist circles, AAROM supination to neutral            Vision       Perception     Praxis      Cognition Arousal: Alert Behavior During Therapy: WFL for tasks assessed/performed Overall Cognitive Status: Within Functional Limits for tasks assessed  Exercises General Exercises - Upper Extremity Shoulder Flexion: AROM, Seated, Left Shoulder ABduction: AROM, Left, Seated Shoulder ADduction: AROM, Seated, Left Elbow Flexion: Left, AAROM, Seated Elbow Flexion Limitations: AAROM about 100* Elbow Extension: AROM, Left, Limitations Elbow Extension Limitations: a bit limited by dressing, some ext lag Hand Exercises Forearm Supination: AAROM, Left, Seated (x3 reps) Other Exercises Other Exercises: wrist circles x5 reps LUE    Shoulder Instructions       General Comments      Pertinent Vitals/ Pain       Pain Assessment Pain Assessment: Faces Faces Pain Scale: Hurts little more Pain Location: LUE with movement Pain Descriptors / Indicators: Aching, Discomfort Pain Intervention(s): Limited activity within patient's tolerance, Monitored during session  Home Living                                          Prior Functioning/Environment              Frequency  Min 1X/week         Progress Toward Goals  OT Goals(current goals can now be found in the care plan section)  Progress towards OT goals: Progressing toward goals  Acute Rehab OT Goals Patient Stated Goal: To go home OT Goal Formulation: With patient Time For Goal Achievement: 10/24/23 Potential to Achieve Goals: Good  Plan      Co-evaluation                 AM-PAC OT "6 Clicks" Daily Activity     Outcome Measure   Help from another person eating meals?: None Help from another person taking care of personal grooming?: A Little Help from another person toileting, which includes using toliet, bedpan, or urinal?: A Little Help from another person bathing (including washing, rinsing, drying)?: A Little Help from another person to put on and taking off regular upper body clothing?: A Little Help from another person to put on and taking off regular lower body clothing?: A Little 6 Click Score: 19    End of Session    OT Visit Diagnosis: Pain;Unsteadiness on feet (R26.81) Pain - Right/Left: Left Pain - part of body: Arm   Activity Tolerance Patient tolerated treatment well   Patient Left in chair;with call bell/phone within reach;with chair alarm set   Nurse Communication Mobility status        Time: 4132-4401 OT Time Calculation (min): 16 min  Charges: OT General Charges $OT Visit: 1 Visit OT Treatments $Therapeutic Activity: 8-22 mins  10/11/2023  AB, OTR/L  Acute Rehabilitation Services  Office: [phone]   Tristan Schroeder 10/11/2023, 3:01 PM

## 2023-10-11 NOTE — Plan of Care (Signed)
  Problem: Education: Goal: Knowledge of General Education information will improve Description: Including pain rating scale, medication(s)/side effects and non-pharmacologic comfort measures Outcome: Adequate for Discharge   Problem: Health Behavior/Discharge Planning: Goal: Ability to manage health-related needs will improve Outcome: Adequate for Discharge   Problem: Clinical Measurements: Goal: Ability to maintain clinical measurements within normal limits will improve Outcome: Adequate for Discharge Goal: Will remain free from infection Outcome: Adequate for Discharge Goal: Diagnostic test results will improve Outcome: Adequate for Discharge Goal: Respiratory complications will improve Outcome: Adequate for Discharge Goal: Cardiovascular complication will be avoided Outcome: Adequate for Discharge   Problem: Activity: Goal: Risk for activity intolerance will decrease Outcome: Adequate for Discharge   Problem: Nutrition: Goal: Adequate nutrition will be maintained Outcome: Adequate for Discharge   Problem: Coping: Goal: Level of anxiety will decrease Outcome: Adequate for Discharge   Problem: Elimination: Goal: Will not experience complications related to bowel motility Outcome: Adequate for Discharge Goal: Will not experience complications related to urinary retention Outcome: Adequate for Discharge   Problem: Pain Management: Goal: General experience of comfort will improve Outcome: Adequate for Discharge   Problem: Safety: Goal: Ability to remain free from injury will improve Outcome: Adequate for Discharge   Problem: Skin Integrity: Goal: Risk for impaired skin integrity will decrease Outcome: Adequate for Discharge   Problem: Acute Rehab PT Goals(only PT should resolve) Goal: Patient Will Transfer Sit To/From Stand Outcome: Adequate for Discharge Goal: Pt Will Transfer Bed To Chair/Chair To Bed Outcome: Adequate for Discharge Goal: Pt Will  Ambulate Outcome: Adequate for Discharge Goal: Pt Will Go Up/Down Stairs Outcome: Adequate for Discharge   Problem: Acute Rehab OT Goals (only OT should resolve) Goal: Pt. Will Perform Grooming Outcome: Adequate for Discharge Goal: Pt. Will Perform Lower Body Bathing Outcome: Adequate for Discharge Goal: Pt. Will Perform Upper Body Dressing Outcome: Adequate for Discharge Goal: Pt. Will Perform Lower Body Dressing Outcome: Adequate for Discharge Goal: Pt/Caregiver Will Perform Home Exercise Program Outcome: Adequate for Discharge

## 2023-10-11 NOTE — Progress Notes (Signed)
Orthopaedic Trauma Service Progress Note  Patient ID: Karla Nichols MRN: [id number] DOB/AGE: 07/12/1986 37 y.o.  Subjective:  Feeling better today but L leg feels weak and difficult to move when mobilizing Bone graft donor site more of a sting than anything   Will be staying with friends at dc    ROS As above  Objective:   VITALS:   Vitals:   10/10/23 1444 10/10/23 1926 10/11/23 0458 10/11/23 0900  BP: 116/71 122/76 129/86 120/71  Pulse: 89 89 100 100  Resp: 16 17 18    Temp: 98.4 F (36.9 C) 98 F (36.7 C) 98.1 F (36.7 C) 98.5 F (36.9 C)  TempSrc: Oral Oral Oral Oral  SpO2: 99% 97% 100% 100%  Weight:      Height:        Estimated body mass index is 26.39 kg/m as calculated from the following:   Height as of this encounter: 5\' 3"  (1.6 m).   Weight as of this encounter: 67.6 kg.   Intake/Output      11/20 0701 11/21 0700 11/21 0701 11/22 0700   P.O.  240   I.V. (mL/kg)     IV Piggyback     Total Intake(mL/kg)  240 (3.6)   Urine (mL/kg/hr) 800 (0.5)    Blood     Total Output 800    Net -800 +240        Urine Occurrence 2 x      LABS  No results found for this or any previous visit (from the past 24 hour(s)).   PHYSICAL EXAM:  Gen: in bed, looks more comfortable  Lungs: Unlabored Cardiac: Regular Ext:       Left upper extremity             Dressing is clean, dry and intact   Dressing removed    Incision looks great    No signs of infection              Extremity is warm             Capillary refill             Radial, ulnar, median nerve motor and sensory functions intact.  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   -Left ulnar nonunion s/p removal of hardware, autografting with left iliac crest bone graft, placement of  new ulnar plate Weightbearing No lifting with left upper extremity Try to limit axial loading on left forearm               ROM/Activity                         As above                         Digit and wrist motion as tolerated    Elbow motion as tolerated               Wound care                         Dressing changed today     Can change in another 48 hours      Mepilex, 4x4 gauze and ace wrap     Leave open to air once dry and may clean with soap and water once there is no drainage                Continue with therapies   - Pain management:             Multimodal   - Medical issues              Gait disturbance                         Increases risk of falls   - ID:              Perioperative antibiotics                          Intraoperative cultures showed no growth to date     - Dispo:             Pain control, therapies              Home either later this pm or tomorrow   Meds sent to [facility] pharmacy     [name], PA-C [phone] (C) 10/11/2023, 9:31 AM  [facility] [address] [place] [address] [phone] [phone] (F)    After 5pm and on the weekends please log on to Amion, go to orthopaedics and the look under the [facility] Call for the provider(s) on call. You can also call our office at [phone] and then follow the prompts to be connected to the call team.  Patient ID: Karla Nichols,  female   DOB: 02/26/1986, 37 y.o.   MRN: [id number]

## 2023-10-11 NOTE — Progress Notes (Signed)
Discharge date verified with Montez Morita, PA-C. Dola Factor stated he gave the patient any option to leave today or tomorrow. I made Montez Morita PA-C aware that the patient was choosing to leave tomorrow instead of today.

## 2023-10-11 NOTE — Progress Notes (Addendum)
     Durable Medical Equipment  (From admission, onward)           Start     Ordered   10/11/23 1210  For home use only DME Walker platform  Once       Question:  Patient needs a walker to treat with the following condition  Answer:  Gait abnormality   10/11/23 1210

## 2023-10-12 ENCOUNTER — Other Ambulatory Visit (HOSPITAL_COMMUNITY): Payer: Self-pay

## 2023-10-12 ENCOUNTER — Encounter (HOSPITAL_COMMUNITY): Payer: Self-pay | Admitting: Orthopedic Surgery

## 2023-10-12 DIAGNOSIS — S52202K Unspecified fracture of shaft of left ulna, subsequent encounter for closed fracture with nonunion: Secondary | ICD-10-CM | POA: Diagnosis not present

## 2023-10-12 DIAGNOSIS — Z23 Encounter for immunization: Secondary | ICD-10-CM | POA: Diagnosis not present

## 2023-10-12 DIAGNOSIS — E559 Vitamin D deficiency, unspecified: Secondary | ICD-10-CM | POA: Diagnosis not present

## 2023-10-12 DIAGNOSIS — W19XXXD Unspecified fall, subsequent encounter: Secondary | ICD-10-CM | POA: Diagnosis not present

## 2023-10-12 DIAGNOSIS — R296 Repeated falls: Secondary | ICD-10-CM | POA: Diagnosis not present

## 2023-10-12 DIAGNOSIS — T84123A Displacement of internal fixation device of bone of left forearm, initial encounter: Secondary | ICD-10-CM | POA: Diagnosis not present

## 2023-10-12 DIAGNOSIS — S52002K Unspecified fracture of upper end of left ulna, subsequent encounter for closed fracture with nonunion: Secondary | ICD-10-CM | POA: Diagnosis not present

## 2023-10-12 DIAGNOSIS — G119 Hereditary ataxia, unspecified: Secondary | ICD-10-CM | POA: Diagnosis not present

## 2023-10-12 DIAGNOSIS — S52132K Displaced fracture of neck of left radius, subsequent encounter for closed fracture with nonunion: Secondary | ICD-10-CM | POA: Diagnosis not present

## 2023-10-12 HISTORY — DX: Vitamin D deficiency, unspecified: E55.9

## 2023-10-12 NOTE — Plan of Care (Signed)
  Problem: Education: Goal: Knowledge of General Education information will improve Description: Including pain rating scale, medication(s)/side effects and non-pharmacologic comfort measures Outcome: Not Progressing   Problem: Health Behavior/Discharge Planning: Goal: Ability to manage health-related needs will improve Outcome: Not Progressing   Problem: Clinical Measurements: Goal: Ability to maintain clinical measurements within normal limits will improve Outcome: Not Progressing Goal: Will remain free from infection Outcome: Not Progressing Goal: Diagnostic test results will improve Outcome: Not Progressing   Problem: Activity: Goal: Risk for activity intolerance will decrease Outcome: Not Progressing   Problem: Nutrition: Goal: Adequate nutrition will be maintained Outcome: Not Progressing   Problem: Coping: Goal: Level of anxiety will decrease Outcome: Not Progressing   Problem: Safety: Goal: Ability to remain free from injury will improve Outcome: Not Progressing   Problem: Skin Integrity: Goal: Risk for impaired skin integrity will decrease Outcome: Not Progressing

## 2023-10-12 NOTE — Progress Notes (Signed)
AVS given and explained to patient.

## 2023-10-12 NOTE — Progress Notes (Signed)
Orthopaedic Trauma Service Progress Note  Patient ID: Karla Nichols MRN: [id number] DOB/AGE: [birth year] 37 y.o.  Subjective:  No new issues  Ready for dc  Boyfriend coming this afternoon  ROS As above  Objective:   VITALS:   Vitals:   10/11/23 1545 10/11/23 2000 10/12/23 0525 10/12/23 0722  BP: 123/68 133/82 (!) 126/91 118/84  Pulse: 100 (!) 110 94 97  Resp: 16 18 16 16   Temp: 98.7 F (37.1 C) 99.3 F (37.4 C) 98.9 F (37.2 C) 98.3 F (36.8 C)  TempSrc: Oral Oral Oral Oral  SpO2: 98% 97% 98% 97%  Weight:      Height:        Estimated body mass index is 26.39 kg/m as calculated from the following:   Height as of this encounter: 5\' 3"  (1.6 m).   Weight as of this encounter: 67.6 kg.   Intake/Output      11/21 0701 11/22 0700 11/22 0701 11/23 0700   P.O. 480    Total Intake(mL/kg) 480 (7.1)    Urine (mL/kg/hr) 400 (0.2)    Total Output 400    Net +80         Urine Occurrence 2 x      LABS  No results found for this or any previous visit (from the past 24 hour(s)).   PHYSICAL EXAM:   Gen: Sitting in bedside chair with legs crossed under her, pleasant, looks much better Lungs: Unlabored Cardiac: Regular Ext:       Left upper extremity             Dressing is clean, dry and intact             Extremity is warm             Capillary refill             Radial, ulnar, median nerve motor and sensory functions intact.  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   -Left ulnar nonunion s/p removal of hardware, autografting with left iliac crest bone graft, placement of new ulnar plate Weightbearing No lifting with left upper extremity Try to limit axial loading on left forearm               ROM/Activity                         As above  Digit and wrist motion as tolerated                          Elbow motion as tolerated                Wound care                         Dressing changes as needed starting tomorrow, reviewed with pt                                                 Mepilex, 4x4 gauze and ace wrap                                                 Leave open to air once dry and may clean with soap and water once there is no drainage                Continue with therapies   - Pain management:             Multimodal   - Medical issues              Gait disturbance                         Increases risk of falls   - ID:              Perioperative antibiotics                          Intraoperative cultures showed no growth to date     - Dispo:             Home this afternoon  Follow-up with orthopedics in 2 weeks             Meds sent to [facility] pharmacy    [name], PA-C [phone] (C) 10/12/2023, 9:03 AM  [facility] [address] [place] [address] [phone] [phone] (F)    After 5pm and on the weekends please log on to Amion, go to orthopaedics and the look under the [facility] Call for the provider(s) on call. You can also call our office at [phone] and then follow the prompts to be connected to the call team.  Patient ID: Karla Nichols, female   DOB: 1986-01-06, 37 y.o.   MRN: [id number]

## 2023-10-12 NOTE — Progress Notes (Signed)
Physical Therapy Treatment Patient Details Name: Karla Nichols MRN: [id number] DOB: 05/10/1986 Today's Date: 10/12/2023   History of Present Illness 37 y.o. 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 PMHx: cerebellar ataxia, prior ORIF ulnar fx in 2017 and 2018.    PT Comments  Worked on safety with transfers using the L platform walker and gait training in today's session. Adjusted L platform RW to proper height before beginning gait training. Pt needed cues for proper hand placement when standing with L platform RW. Pt occasionally needed minimal assistance for initial boost upon raising from lower surfaces. Pt with increased steadiness today, however, pt has difficulty turning with L platform RW. Pt feels safe to be discharging home and did not feel it was necessary to practice steps again. Gave pt a gait belt for support system to use when ambulating and with steps. Pt is progressing well towards goals. Acute PT to follow.     If plan is discharge home, recommend the following: A little help with walking and/or transfers;A little help with bathing/dressing/bathroom;Assistance with cooking/housework;Help with stairs or ramp for entrance;Assist for transportation   Can travel by private vehicle      Yes  Equipment Recommendations  Other (comment) (L platform walker)       Precautions / Restrictions Precautions Precautions: Fall Restrictions Weight Bearing Restrictions: Yes LUE Weight Bearing: Weight bear through elbow only Other Position/Activity Restrictions: while using platform walker per Montez Morita, PA-C     Mobility  Bed Mobility Overal bed mobility: Modified Independent Bed Mobility: Supine to Sit     Supine to sit: Modified independent (Device/Increase time), HOB elevated     General bed mobility comments: increased time, HOB elevated    Transfers Overall transfer level: Needs assistance Equipment used: Left platform  walker Transfers: Sit to/from Stand Sit to Stand: Min assist, Contact guard assist    General transfer comment: MinA for boost up, w/ increased repetition, CGA. Cues for hand placement    Ambulation/Gait Ambulation/Gait assistance: Contact guard assist Gait Distance (Feet): 60 Feet (x40, x20) Assistive device: Left platform walker Gait Pattern/deviations: Step-through pattern, Decreased step length - left, Decreased stance time - left Gait velocity: dec     General Gait Details: slightly unsteady requiring CGA, difficulty with turns         Balance Overall balance assessment: Needs assistance, History of Falls Sitting-balance support: No upper extremity supported, Feet supported Sitting balance-Leahy Scale: Good     Standing balance support: Single extremity supported, During functional activity, Reliant on assistive device for balance Standing balance-Leahy Scale: Poor Standing balance comment: reliant on UE support for balance in standing     Cognition Arousal: Alert Behavior During Therapy: WFL for tasks assessed/performed Overall Cognitive Status: Within Functional Limits for tasks assessed     Exercises Other Exercises Other Exercises: 3 serial STS w/ L platform walker    General Comments General comments (skin integrity, edema, etc.): VSS on RA, no sling in room      Pertinent Vitals/Pain Pain Assessment Pain Assessment: Faces Faces Pain Scale: Hurts a little bit Pain Location: LUE with movement Pain Descriptors / Indicators: Aching, Discomfort Pain Intervention(s): Limited activity within patient's tolerance, Monitored during session, Repositioned     PT Goals (current goals can now be found in the care plan section) Acute Rehab PT Goals PT Goal Formulation: With patient Time For Goal Achievement: 10/24/23 Potential to Achieve Goals: Good Progress towards PT goals: Progressing toward  goals    Frequency    Min 1X/week       AM-PAC PT "6  Clicks" Mobility   Outcome Measure  Help needed turning from your back to your side while in a flat bed without using bedrails?: None Help needed moving from lying on your back to sitting on the side of a flat bed without using bedrails?: None Help needed moving to and from a bed to a chair (including a wheelchair)?: A Little Help needed standing up from a chair using your arms (e.g., wheelchair or bedside chair)?: A Little Help needed to walk in hospital room?: A Little Help needed climbing 3-5 steps with a railing? : A Lot 6 Click Score: 19    End of Session Equipment Utilized During Treatment: Gait belt Activity Tolerance: Patient tolerated treatment well Patient left: in chair;with call bell/phone within reach Nurse Communication: Mobility status PT Visit Diagnosis: Unsteadiness on feet (R26.81);Repeated falls (R29.6)     Time: 1610-9604 PT Time Calculation (min) (ACUTE ONLY): 19 min  Charges:    $Gait Training: 8-22 mins PT General Charges $$ ACUTE PT VISIT: 1 Visit                     Hilton Cork, PT, DPT Secure Chat Preferred  Rehab Office [phone]   Arturo Morton Brion Aliment 10/12/2023, 9:15 AM

## 2023-10-14 LAB — AEROBIC/ANAEROBIC CULTURE W GRAM STAIN (SURGICAL/DEEP WOUND)
Culture: NO GROWTH
Culture: NO GROWTH
Gram Stain: NONE SEEN
Gram Stain: NONE SEEN

## 2023-10-14 NOTE — Op Note (Signed)
Karla Nichols, PRISBY MEDICAL RECORD NO: [id number] ACCOUNT NO: [id number] DATE OF BIRTH: 04-15-1986 FACILITY: [facility] LOCATION: [facility] PHYSICIAN: Doralee Albino. Carola Frost, MD  Operative Report   DATE OF PROCEDURE: 10/09/2023  PREOPERATIVE DIAGNOSES: 1.  Left proximal ulna nonunion. 2.  Two retained plates, left ulna. 3.  Suspected osteomyelitis.  POSTOPERATIVE DIAGNOSES: 1.  Left proximal ulna nonunion. 2.  Two retained plates, left ulna, with loosening of proximal screw. 3.  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  PROCEDURES: 1.  Partial excision of left ulna for suspected osteomyelitis. 2.  Open reduction and internal fixation for repair of left ulna nonunion using Acumed plate and left iliac crest bone grafting. 3.  Removal of deep implant x 2, left ulna.  SURGEON:  Myrene Galas, MD.  ASSISTANT:  PA student.  ANESTHESIA:  General, supplemented with nerve block.  SPECIMENS:  Multiple anaerobic, aerobic from left ulna pseudoarthrosis.  PATIENT DISPOSITION:  To PACU.  CONDITION:  Stable.  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 The patient is status post repair of two prior forearm injuries and has had two plates  placed for repair of those on the ulna in addition to a third plate on the radius.  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  She denies drainage, but has had prominent swelling about the ulna just distal to the  articular surface and pain for at least a year.  She has consulted with other orthopedic surgeons, but because of the complexity of this situation ultimately was referred to [place] as it was deemed most appropriate for a fellowship-trained orthopedic  traumatologist. I discussed with the patient the high probability that there could be infection and that if so this would need to be a staged procedure. We also  discussed persistent nonunion, loss of motion, need for further surgery, symptomatic hardware, and  others. She acknowledged these risks and strongly wished to proceed and provided consent to do so.  BRIEF SUMMARY OF PROCEDURE:  Antibiotics were held preoperatively. She did receive a regional block. She was taken to the OR room where general anesthesia was induced. The left upper extremity was prepped and draped in usual fashion after chlorhexidine  wash, Betadine scrub and paint. We made the old subcutaneous border incision and carefully carried dissection down to the plate. There was actually a plate on the dorsal and volar sides. The tip of the proximal plate and the most proximal screw actually was  within the pseudoarthrosis itself.  I had to make an extra-length incision just in order to remove the hardware and this was not necessary for direct repair of the nonunion itself. The head of one of the screws stripped during attempted removal and this required use of a broken  hardware removal set in order to drill away the head, extract the plate over the screw, and then grasp the distal end of the screw with an easy out. Because of suspected osteomyelitis I began with a formal partial excision of the ulna, using a 15 blade to define the border between sift tissue and bone over the entire area. I then scraped the entirety of the pseudoarthrosis cavity and up into to each end of the bone. I also used a drill and curette to reestablish canals both proximally and distally as the bone ends were  capped from its chronic nature and partially excised any questionable bone.  Cultures obtained from the ulna were sent to Micro and Pathology. The area was irrigated thoroughly including up and down the bone. At no time did I encounter purulence, however, and cultures from the lab would eventually be reported as no bacteria or large numbers of white cells seen.   Given this lab finding and the patient's neurologic  condition with heightened potential for further falls, it was decided to proceed with fixation.   I turned my attention to the left iliac crest, made a 5 cm incision, carried dissection down through the amuscular plane to the pelvic brim.  I created a trap door and reflected this. Although her pelvic crest seemed normal in shape and size such that there would be a good  quantity of graft to be harvested, the tables tapered together quickly. Because of this narrow morphology we were unable to obtain as much graft as hoped, but we were still able to get 10 cc of high quality autograft. This was carefully collected and combined with a small amount of cancellous allograft bone as well. Regarding the fracture nonunion, I used the long Acumed olecranon plate securing provisional fixation with pins, checking under C-arm on both AP and lateral views and then proceeding with standard fixation, pulling maximal  distraction to restore length of the ulna. This was followed by additional placement of screws. I was cognizant of not creating too stiff of a construct distally in shaft. Once this was complete, the cavity was then packed with the autograft and  allograft mix.  It was sutured into place with PDS and then 2-0 PDS and 2-0 nylon used for shallow subcutaneous and skin respectively. The forearm as well as the hip was treated with Zynrelef in addition to a Gelfoam at the iliac crest donor site to  provide local analgesia.  A sterile bulky dressing was then applied with compression from wrist to shoulder, but no formal splint. The patient was awakened from anesthesia and transported to the PACU in stable condition. There were no complications.  PROGNOSIS:  The patient will begin immediate gentle range of motion. I did avoid a splint out of concern that this may throw off the patient's balance and instead used a bulky soft dressing. I will plan to see her back in the office for removal of sutures in two weeks. She  will stay overnight. We will follow her cultures as well.        PAA D: 10/14/2023 12:41:48 am T: 10/14/2023 2:17:00 am  JOB: [id number]/ [id number]

## 2023-10-31 DIAGNOSIS — S52132K Displaced fracture of neck of left radius, subsequent encounter for closed fracture with nonunion: Secondary | ICD-10-CM | POA: Diagnosis not present

## 2023-10-31 DIAGNOSIS — S52002M Unspecified fracture of upper end of left ulna, subsequent encounter for open fracture type I or II with nonunion: Secondary | ICD-10-CM | POA: Diagnosis not present

## 2023-11-28 DIAGNOSIS — S52132K Displaced fracture of neck of left radius, subsequent encounter for closed fracture with nonunion: Secondary | ICD-10-CM | POA: Diagnosis not present

## 2023-11-28 DIAGNOSIS — S52002M Unspecified fracture of upper end of left ulna, subsequent encounter for open fracture type I or II with nonunion: Secondary | ICD-10-CM | POA: Diagnosis not present

## 2023-12-26 DIAGNOSIS — S52132K Displaced fracture of neck of left radius, subsequent encounter for closed fracture with nonunion: Secondary | ICD-10-CM | POA: Diagnosis not present

## 2023-12-26 DIAGNOSIS — S52002M Unspecified fracture of upper end of left ulna, subsequent encounter for open fracture type I or II with nonunion: Secondary | ICD-10-CM | POA: Diagnosis not present

## 2024-01-30 DIAGNOSIS — S52132K Displaced fracture of neck of left radius, subsequent encounter for closed fracture with nonunion: Secondary | ICD-10-CM | POA: Diagnosis not present

## 2024-01-30 DIAGNOSIS — S52002M Unspecified fracture of upper end of left ulna, subsequent encounter for open fracture type I or II with nonunion: Secondary | ICD-10-CM | POA: Diagnosis not present

## 2024-04-30 DIAGNOSIS — S52132K Displaced fracture of neck of left radius, subsequent encounter for closed fracture with nonunion: Secondary | ICD-10-CM | POA: Diagnosis not present

## 2024-04-30 DIAGNOSIS — S52002M Unspecified fracture of upper end of left ulna, subsequent encounter for open fracture type I or II with nonunion: Secondary | ICD-10-CM | POA: Diagnosis not present

## 2024-07-28 ENCOUNTER — Other Ambulatory Visit (HOSPITAL_BASED_OUTPATIENT_CLINIC_OR_DEPARTMENT_OTHER): Payer: Self-pay

## 2024-09-15 ENCOUNTER — Other Ambulatory Visit: Payer: Self-pay | Admitting: Orthopedic Surgery

## 2024-09-15 DIAGNOSIS — S52002M Unspecified fracture of upper end of left ulna, subsequent encounter for open fracture type I or II with nonunion: Secondary | ICD-10-CM | POA: Diagnosis not present

## 2024-09-15 DIAGNOSIS — S52132K Displaced fracture of neck of left radius, subsequent encounter for closed fracture with nonunion: Secondary | ICD-10-CM | POA: Diagnosis not present

## 2024-09-24 DIAGNOSIS — S52002M Unspecified fracture of upper end of left ulna, subsequent encounter for open fracture type I or II with nonunion: Secondary | ICD-10-CM | POA: Diagnosis not present

## 2024-09-25 ENCOUNTER — Ambulatory Visit
Admission: RE | Admit: 2024-09-25 | Discharge: 2024-09-25 | Disposition: A | Discharge status: Home / Self Care | Source: Ambulatory Visit | Attending: Orthopedic Surgery | Admitting: Orthopedic Surgery

## 2024-09-25 DIAGNOSIS — S52002M Unspecified fracture of upper end of left ulna, subsequent encounter for open fracture type I or II with nonunion: Secondary | ICD-10-CM

## 2024-09-25 DIAGNOSIS — S42402A Unspecified fracture of lower end of left humerus, initial encounter for closed fracture: Secondary | ICD-10-CM | POA: Diagnosis not present

## 2024-11-04 ENCOUNTER — Encounter (HOSPITAL_COMMUNITY): Payer: Self-pay | Admitting: Orthopedic Surgery

## 2024-11-04 ENCOUNTER — Other Ambulatory Visit: Payer: Self-pay

## 2024-11-04 NOTE — Pre-Procedure Instructions (Signed)
-------------    SDW INSTRUCTIONS given:  Your procedure is scheduled on 12/18.  Report to [facility] Main Entrance A at 05:30 A.M., and check in at the Admitting office.  Any questions or running late day of surgery: call [phone]    Remember:  Do not eat after midnight the night before your surgery  You may drink clear liquids until 05:00 AM the morning of your surgery.   Clear liquids allowed are: Water, Non-Citrus Juices (without pulp), Carbonated Beverages, Clear Tea, Black Coffee Only, and Gatorade    Take these medicines the morning of surgery with A SIP OF WATER  cetirizine (ZYRTEC)     May take these medicines IF NEEDED: acetaminophen  (TYLENOL )  methocarbamol  (ROBAXIN )  oxyCODONE  (OXY IR/ROXICODONE )    As of today, STOP taking any Aspirin  (unless otherwise instructed by your surgeon) Aleve , Naproxen , Ibuprofen , Motrin , Advil , Goody's, BC's, all herbal medications, fish oil, and all vitamins.   Do NOT Smoke (Tobacco/Vaping) 24 hours prior to your procedure  If you use a CPAP at night, you may bring all equipment for your overnight stay.     You will be asked to remove any contacts, glasses, piercing's, hearing aid's, dentures/partials prior to surgery. Please bring cases for these items if needed.     Patients discharged the day of surgery will not be allowed to drive home, and someone needs to stay with them for 24 hours.  SURGICAL WAITING ROOM VISITATION Patients may have no more than 2 support people in the waiting area - these visitors may rotate.   Pre-op nurse will coordinate an appropriate time for 1 ADULT support person, who may not rotate, to accompany patient in pre-op.  Children under the age of 76 must have an adult with them who is not the patient and must remain in the main waiting area with an adult.  If the patient needs to stay at the hospital during part of their recovery, the visitor guidelines for inpatient rooms apply.  Please refer to the  [facility] website for the visitor guidelines for any additional information.   Special instructions:   - Preparing For Surgery   Please follow these instructions carefully.   Shower the NIGHT BEFORE SURGERY and the MORNING OF SURGERY with DIAL Soap.   Pat yourself dry with a CLEAN TOWEL.  Wear CLEAN PAJAMAS to bed the night before surgery  Place CLEAN SHEETS on your bed the night of your first shower and DO NOT SLEEP WITH PETS.   Additional instructions for the day of surgery: DO NOT APPLY any lotions, deodorants, cologne, or perfumes.   Do not wear jewelry or makeup Do not wear nail polish, gel polish, artificial nails, or any other type of covering on natural nails (fingers and toes) Do not bring valuables to the hospital. [facility] is not responsible for valuables/personal belongings. Put on clean/comfortable clothes.  Please brush your teeth.  Ask your nurse before applying any prescription medications to the skin.

## 2024-11-04 NOTE — Progress Notes (Signed)
 Called dr. Layman for orders; scheduler is aware.

## 2024-11-04 NOTE — Progress Notes (Signed)
 PCP - Dr. [employer] Cardiologist - denies  PPM/ICD - denies   Chest x-ray - 06/08/17 EKG - n/a Stress Test - denies ECHO - denies Cardiac Cath - denies  CPAP - denies  DM- denies  ASA/Blood Thinner Instructions: n/a   ERAS Protcol - clears until 0500  COVID TEST- n/a  Anesthesia review: no  Patient verbally denies any shortness of breath, fever, cough and chest pain during phone call       Questions were answered. Patient verbalized understanding of instructions.

## 2024-11-05 NOTE — H&P (Signed)
 Orthopaedic Trauma Service (OTS) Consult   Patient ID: Karla Nichols MRN: [id number] DOB/AGE: [birth year] 39 y.o.    HPI: Karla Nichols is an 38 y.o. female Karla Nichols is an 38 y.o. 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  She has not been on any medications for this.  Resultant imbalance and unsteady gait along with frequent falls as well as crashing off walls or objects while ambulating.  Issues with her left wrist began in 1999 with left wrist fracture 2007 she had a both bone forearm fracture that is treated with ORIF by Dr. Vernetta.  2018 she had an open fracture treated with an additional plate by Dr. Lucilla.  She has had long standing radial neck nonunion at least since that time she denies any pain with forearm rotation but she has some discomfort in the forearm particularly the elbow area for the last year.  She notes clicking in her DRUJ since 1999.  Denies having any wound drainage or difficulty with healing.  Denies any numbness or tingling in the arm currently works as a conservation officer, nature at [employer] and lives at home with her father after the passing of her mother in the past year and receives additional help and support from her church she does not smoke occasionally drinks alcohol.  Patient underwent ORIF of her left ulna nonunion by our office (Dr. Celena) on 10/09/2023.  She had been followed closely and fortunately developed some increasing pain and swelling about her elbow.  Denied any additional trauma was seen in the office on 09/15/2024 where x-rays of her left elbow demonstrated fracture of her ulna plate with persistent nonunion of her ulna though smaller than her previous nonunion.  Hardware remains stable.  Confirmed with CT scan.  Presents today for repair of her persistent and chronic left ulna nonunion.  We did use iliac crest bone graft at her last procedure but the yield was quite small and her pelvis was very fragile.  We discussed different treatment options in  terms of grafting and anticipate doing reamed intramedullary aspirate from her left femur.  Risks and benefits of surgery been reviewed with the patient she wished to proceed.  Past Medical History:  Diagnosis Date   Abnormality of gait    Gait abnormality    Vitamin D  insufficiency 10/12/2023    Past Surgical History:  Procedure Laterality Date   HARDWARE REMOVAL Left 10/09/2023   Procedure: HARDWARE REMOVAL;  Surgeon: Celena Sharper, MD;  Location: [facility] OR;  Service: Orthopedics;  Laterality: Left;   ORIF ULNAR FRACTURE Left 06/13/2016   Procedure: OPEN REDUCTION INTERNAL FIXATION (ORIF) LEFT RADIUS AND ULNA FRACTURES;  Surgeon: Lonni CINDERELLA Vernetta, MD;  Location: [facility] OR;  Service: Orthopedics;  Laterality: Left;   ORIF ULNAR FRACTURE Left 06/09/2017   Procedure: OPEN REDUCTION INTERNAL FIXATION (ORIF) ULNAR FRACTURE INCISION AND DRAINAGE EXAM UNDER ANESTHESIA RADIAL HEAD;  Surgeon: Lucilla Lynwood FORBES, MD;  Location: [facility] ORS;  Service: Orthopedics;  Laterality: Left;   ORIF ULNAR FRACTURE Left 10/09/2023   Procedure: NONUNION REPAIR ULNA WITH ILIAC CREST BONE GRAFTING;  Surgeon: Celena Sharper, MD;  Location: [facility] OR;  Service: Orthopedics;  Laterality: Left;    Family History  Problem Relation Age of Onset   Hypertension Mother    Diabetes Mother     Social History:  reports that she has never smoked. She has never used smokeless tobacco. She reports current alcohol use of about 1.0 standard drink  of alcohol per week. She reports that she does not use drugs.  Allergies: Allergies[1]  Medications: I have reviewed the patient's current medications. Current Outpatient Medications  Medication Instructions   Acetaminophen  Extra Strength 500-1,000 mg, Oral, Every 8 hours PRN   acidophilus (RISAQUAD) CAPS capsule 1 capsule, Oral, Daily   Calcium Carb-Cholecalciferol  (CALTRATE 600+D3 PO) 1 tablet, Oral, Daily   cetirizine (ZYRTEC) 10 mg, Oral, Daily   methocarbamol  (ROBAXIN ) 500 mg, Oral,  Every 6 hours PRN   oxyCODONE  (OXY IR/ROXICODONE ) 5-10 mg, Oral, Every 6 hours PRN     No results found for this or any previous visit (from the past 48 hours).  No results found.  Intake/Output    None      ROS Left elbow swelling and pain Height 5' 4 (1.626 m), weight 67.6 kg, last menstrual period 10/14/2024. 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 hardware  -Persistent left proximal ulna nonunion with broken hardware  OR for removal of hardware and repair of nonunion  Anticipate obtaining reamed intramedullary aspirate from her left femur for autografting   Patient will need x-rays preoperatively of her left femur   Risks and benefits of surgery reviewed the patient she wished to proceed    Would likely keep her overnight to work with therapy in the morning if we do take reaffirm her left femur given her baseline cerebellar ataxia   - Metabolic Bone Disease:  Will obtain more comprehensive labs to see if we can further determine why she has persistent nonunion.  This is quite surprising given her age and lack of additional comorbidities  Labs will include vitamin D  panel, TSH, PTH, intact calcium, albumin , magnesium, phosphorus, and hemoglobin A1c   Additional labs that would be beneficial will need to be obtained in the outpatient setting and these include P1 NP and either CTX or NTX which would determine her bone building and bone resorption capacities  respectively   Would also advocate for her getting a DEXA scan at this juncture given the chronicity of her issues.  May also benefit from going to a facility that can also calculate her trabecular bone score  - Impediments to fracture healing:  Established nonunion  Vitamin D  insufficiency - Dispo:  OR for repair of chronic nonunion left ulna    Francis MICAEL Mt, PA-C [phone] (C) 11/05/2024, 4:10 PM  [facility] [address] [place] [address] [phone] (F)    After 5pm and on the weekends please log on to Am[employer], go to orthopaedics and the look under the [facility] Call for the provider(s) on call. You can also call our office at [phone] and then follow the prompts to be connected to the call team.      [1] No Known Allergies

## 2024-11-06 ENCOUNTER — Ambulatory Visit (HOSPITAL_COMMUNITY)

## 2024-11-06 ENCOUNTER — Other Ambulatory Visit: Payer: Self-pay

## 2024-11-06 ENCOUNTER — Encounter (HOSPITAL_COMMUNITY): Payer: Self-pay | Admitting: Orthopedic Surgery

## 2024-11-06 ENCOUNTER — Ambulatory Visit (HOSPITAL_COMMUNITY)
Admission: RE | Admit: 2024-11-06 | Discharge: 2024-11-06 | Disposition: A | Discharge status: Home / Self Care | Attending: Orthopedic Surgery | Admitting: Orthopedic Surgery

## 2024-11-06 ENCOUNTER — Other Ambulatory Visit (HOSPITAL_COMMUNITY): Payer: Self-pay

## 2024-11-06 ENCOUNTER — Encounter: Admission: RE | Disposition: A | Payer: Self-pay | Attending: Orthopedic Surgery

## 2024-11-06 ENCOUNTER — Ambulatory Visit (HOSPITAL_BASED_OUTPATIENT_CLINIC_OR_DEPARTMENT_OTHER)

## 2024-11-06 DIAGNOSIS — Y838 Other surgical procedures as the cause of abnormal reaction of the patient, or of later complication, without mention of misadventure at the time of the procedure: Secondary | ICD-10-CM | POA: Diagnosis not present

## 2024-11-06 DIAGNOSIS — S52302A Unspecified fracture of shaft of left radius, initial encounter for closed fracture: Secondary | ICD-10-CM | POA: Diagnosis not present

## 2024-11-06 DIAGNOSIS — T84018D Broken internal joint prosthesis, other site, subsequent encounter: Secondary | ICD-10-CM | POA: Insufficient documentation

## 2024-11-06 DIAGNOSIS — E559 Vitamin D deficiency, unspecified: Secondary | ICD-10-CM

## 2024-11-06 DIAGNOSIS — N134 Hydroureter: Secondary | ICD-10-CM | POA: Diagnosis not present

## 2024-11-06 DIAGNOSIS — S52002K Unspecified fracture of upper end of left ulna, subsequent encounter for closed fracture with nonunion: Secondary | ICD-10-CM | POA: Insufficient documentation

## 2024-11-06 DIAGNOSIS — S52102M Unspecified fracture of upper end of left radius, subsequent encounter for open fracture type I or II with nonunion: Secondary | ICD-10-CM | POA: Diagnosis present

## 2024-11-06 DIAGNOSIS — X58XXXD Exposure to other specified factors, subsequent encounter: Secondary | ICD-10-CM | POA: Diagnosis not present

## 2024-11-06 DIAGNOSIS — S52002A Unspecified fracture of upper end of left ulna, initial encounter for closed fracture: Secondary | ICD-10-CM | POA: Diagnosis not present

## 2024-11-06 DIAGNOSIS — R1909 Other intra-abdominal and pelvic swelling, mass and lump: Secondary | ICD-10-CM | POA: Diagnosis not present

## 2024-11-06 DIAGNOSIS — R1900 Intra-abdominal and pelvic swelling, mass and lump, unspecified site: Secondary | ICD-10-CM | POA: Diagnosis not present

## 2024-11-06 DIAGNOSIS — S52102K Unspecified fracture of upper end of left radius, subsequent encounter for closed fracture with nonunion: Secondary | ICD-10-CM | POA: Diagnosis not present

## 2024-11-06 DIAGNOSIS — S52252K Displaced comminuted fracture of shaft of ulna, left arm, subsequent encounter for closed fracture with nonunion: Secondary | ICD-10-CM | POA: Diagnosis not present

## 2024-11-06 DIAGNOSIS — Z472 Encounter for removal of internal fixation device: Secondary | ICD-10-CM | POA: Diagnosis not present

## 2024-11-06 DIAGNOSIS — Z01818 Encounter for other preprocedural examination: Secondary | ICD-10-CM | POA: Diagnosis not present

## 2024-11-06 DIAGNOSIS — S52092K Other fracture of upper end of left ulna, subsequent encounter for closed fracture with nonunion: Secondary | ICD-10-CM | POA: Diagnosis present

## 2024-11-06 DIAGNOSIS — S52002M Unspecified fracture of upper end of left ulna, subsequent encounter for open fracture type I or II with nonunion: Secondary | ICD-10-CM | POA: Diagnosis present

## 2024-11-06 HISTORY — PX: REPAIR NON-UNION ULNA: SHX6724

## 2024-11-06 LAB — CBC WITH DIFFERENTIAL/PLATELET
Abs Immature Granulocytes: 0.03 K/uL (ref 0.00–0.07)
Basophils Absolute: 0.1 K/uL (ref 0.0–0.1)
Basophils Relative: 1 %
Eosinophils Absolute: 0.2 K/uL (ref 0.0–0.5)
Eosinophils Relative: 3 %
HCT: 35.8 % — ABNORMAL LOW (ref 36.0–46.0)
Hemoglobin: 11.3 g/dL — ABNORMAL LOW (ref 12.0–15.0)
Immature Granulocytes: 0 %
Lymphocytes Relative: 23 %
Lymphs Abs: 1.7 K/uL (ref 0.7–4.0)
MCH: 25.1 pg — ABNORMAL LOW (ref 26.0–34.0)
MCHC: 31.6 g/dL (ref 30.0–36.0)
MCV: 79.6 fL — ABNORMAL LOW (ref 80.0–100.0)
Monocytes Absolute: 0.5 K/uL (ref 0.1–1.0)
Monocytes Relative: 7 %
Neutro Abs: 4.9 K/uL (ref 1.7–7.7)
Neutrophils Relative %: 66 %
Platelets: 225 K/uL (ref 150–400)
RBC: 4.5 MIL/uL (ref 3.87–5.11)
RDW: 16.3 % — ABNORMAL HIGH (ref 11.5–15.5)
WBC: 7.3 K/uL (ref 4.0–10.5)
nRBC: 0 % (ref 0.0–0.2)

## 2024-11-06 LAB — COMPREHENSIVE METABOLIC PANEL WITH GFR
ALT: 11 U/L (ref 0–44)
AST: 24 U/L (ref 15–41)
Albumin: 4.4 g/dL (ref 3.5–5.0)
Alkaline Phosphatase: 60 U/L (ref 38–126)
Anion gap: 9 (ref 5–15)
BUN: 13 mg/dL (ref 6–20)
CO2: 22 mmol/L (ref 22–32)
Calcium: 8.8 mg/dL — ABNORMAL LOW (ref 8.9–10.3)
Chloride: 106 mmol/L (ref 98–111)
Creatinine, Ser: 0.7 mg/dL (ref 0.44–1.00)
GFR, Estimated: >60 mL/min (ref >60)
Glucose, Bld: 94 mg/dL (ref 70–99)
Potassium: 4 mmol/L (ref 3.5–5.1)
Sodium: 138 mmol/L (ref 135–145)
Total Bilirubin: 0.8 mg/dL (ref 0.0–1.2)
Total Protein: 7.2 g/dL (ref 6.5–8.1)

## 2024-11-06 LAB — POCT PREGNANCY, URINE: Preg Test, Ur: NEGATIVE

## 2024-11-06 LAB — URINE DRUG SCREEN
Amphetamines: NEGATIVE
Barbiturates: NEGATIVE
Benzodiazepines: NEGATIVE
Cocaine: NEGATIVE
Fentanyl: NEGATIVE
Methadone Scn, Ur: NEGATIVE
Opiates: NEGATIVE
Tetrahydrocannabinol: NEGATIVE

## 2024-11-06 LAB — URINALYSIS, ROUTINE W REFLEX MICROSCOPIC
Bilirubin Urine: NEGATIVE
Glucose, UA: NEGATIVE mg/dL
Hgb urine dipstick: NEGATIVE
Ketones, ur: NEGATIVE mg/dL
Nitrite: NEGATIVE
Protein, ur: NEGATIVE mg/dL
Specific Gravity, Urine: 1.019 (ref 1.005–1.030)
pH: 5 (ref 5.0–8.0)

## 2024-11-06 LAB — HEMOGLOBIN A1C
Hgb A1c MFr Bld: 5.3 % (ref 4.8–5.6)
Mean Plasma Glucose: 105.41 mg/dL

## 2024-11-06 LAB — PREALBUMIN: Prealbumin: 17 mg/dL — ABNORMAL LOW (ref 18–38)

## 2024-11-06 LAB — MAGNESIUM: Magnesium: 2 mg/dL (ref 1.7–2.4)

## 2024-11-06 LAB — TSH: TSH: 3.86 u[IU]/mL (ref 0.350–4.500)

## 2024-11-06 LAB — PHOSPHORUS: Phosphorus: 2.7 mg/dL (ref 2.5–4.6)

## 2024-11-06 LAB — C-REACTIVE PROTEIN: CRP: <3 mg/dL — ABNORMAL HIGH (ref <1.0)

## 2024-11-06 LAB — SEDIMENTATION RATE: Sed Rate: 9 mm/h (ref 0–22)

## 2024-11-06 MED ORDER — ACETAMINOPHEN 10 MG/ML IV SOLN
INTRAVENOUS | Status: AC
  Filled 2024-11-06: qty 100

## 2024-11-06 MED ORDER — MIDAZOLAM HCL (PF) 2 MG/2ML IJ SOLN
INTRAMUSCULAR | Status: DC | PRN
  Administered 2024-11-06: 08:00:00 2 mg via INTRAVENOUS

## 2024-11-06 MED ORDER — ACETAMINOPHEN 10 MG/ML IV SOLN
INTRAVENOUS | Status: DC | PRN
  Administered 2024-11-06: 10:00:00 1000 mg via INTRAVENOUS

## 2024-11-06 MED ORDER — IOHEXOL 350 MG/ML SOLN
60.0000 mL | Freq: Once | INTRAVENOUS | Status: AC | PRN
  Administered 2024-11-06: 15:00:00 60 mL via INTRAVENOUS

## 2024-11-06 MED ORDER — OXYCODONE HCL 5 MG PO TABS
ORAL_TABLET | ORAL | Status: AC
  Filled 2024-11-06: qty 1

## 2024-11-06 MED ORDER — AMISULPRIDE (ANTIEMETIC) 5 MG/2ML IV SOLN: 10.0000 mg | Freq: Once | INTRAVENOUS | Status: DC | PRN

## 2024-11-06 MED ORDER — PROPOFOL 10 MG/ML IV BOLUS
INTRAVENOUS | Status: AC
  Filled 2024-11-06: qty 20

## 2024-11-06 MED ORDER — OXYCODONE HCL 5 MG PO TABS
5.0000 mg | ORAL_TABLET | Freq: Once | ORAL | Status: AC | PRN
  Administered 2024-11-06: 14:00:00 5 mg via ORAL

## 2024-11-06 MED ORDER — TOBRAMYCIN SULFATE 1.2 G IJ SOLR
INTRAMUSCULAR | Status: AC
  Filled 2024-11-06: qty 1.2

## 2024-11-06 MED ORDER — ONDANSETRON HCL 4 MG/2ML IJ SOLN: 4.0000 mg | Freq: Once | INTRAMUSCULAR | Status: DC | PRN

## 2024-11-06 MED ORDER — METHYLENE BLUE 20 MG/2ML IV SOSY
PREFILLED_SYRINGE | INTRAVENOUS | Status: AC
  Filled 2024-11-06: qty 2

## 2024-11-06 MED ORDER — OXYCODONE HCL 5 MG PO TABS
5.0000 mg | ORAL_TABLET | Freq: Four times a day (QID) | ORAL | 0 refills | Status: DC | PRN
  Filled 2024-11-06: qty 40, 7d supply, fill #0

## 2024-11-06 MED ORDER — ACETAMINOPHEN 10 MG/ML IV SOLN: 1000.0000 mg | Freq: Once | INTRAVENOUS | Status: DC | PRN

## 2024-11-06 MED ORDER — MIDAZOLAM HCL 2 MG/2ML IJ SOLN
INTRAMUSCULAR | Status: AC
  Filled 2024-11-06: qty 2

## 2024-11-06 MED ORDER — ROCURONIUM BROMIDE 10 MG/ML (PF) SYRINGE
PREFILLED_SYRINGE | INTRAVENOUS | Status: AC
  Filled 2024-11-06: qty 10

## 2024-11-06 MED ORDER — OXYCODONE HCL 5 MG/5ML PO SOLN: 5.0000 mg | Freq: Once | ORAL | Status: AC | PRN

## 2024-11-06 MED ORDER — FENTANYL CITRATE (PF) 100 MCG/2ML IJ SOLN
INTRAMUSCULAR | Status: AC
  Filled 2024-11-06: qty 2

## 2024-11-06 MED ORDER — SUGAMMADEX SODIUM 200 MG/2ML IV SOLN
INTRAVENOUS | Status: DC | PRN
  Administered 2024-11-06: 11:00:00 200 mg via INTRAVENOUS

## 2024-11-06 MED ORDER — FENTANYL CITRATE (PF) 100 MCG/2ML IJ SOLN
25.0000 ug | INTRAMUSCULAR | Status: AC | PRN
  Administered 2024-11-06 (×6): 25 ug via INTRAVENOUS

## 2024-11-06 MED ORDER — ONDANSETRON HCL 4 MG/2ML IJ SOLN
INTRAMUSCULAR | Status: DC | PRN
  Administered 2024-11-06: 11:00:00 4 mg via INTRAVENOUS

## 2024-11-06 MED ORDER — METHOCARBAMOL 500 MG PO TABS
500.0000 mg | ORAL_TABLET | Freq: Four times a day (QID) | ORAL | 0 refills | Status: DC | PRN
  Filled 2024-11-06: qty 30, 8d supply, fill #0

## 2024-11-06 MED ORDER — SUGAMMADEX SODIUM 200 MG/2ML IV SOLN
INTRAVENOUS | Status: AC
  Filled 2024-11-06: qty 2

## 2024-11-06 MED ORDER — 0.9 % SODIUM CHLORIDE (POUR BTL) OPTIME
TOPICAL | Status: DC | PRN
  Administered 2024-11-06: 09:00:00 1000 mL

## 2024-11-06 MED ORDER — LIDOCAINE 2% (20 MG/ML) 5 ML SYRINGE
INTRAMUSCULAR | Status: AC
  Filled 2024-11-06: qty 5

## 2024-11-06 MED ORDER — CEFAZOLIN SODIUM-DEXTROSE 2-3 GM-%(50ML) IV SOLR
INTRAVENOUS | Status: DC | PRN
  Administered 2024-11-06: 09:00:00 2 g via INTRAVENOUS

## 2024-11-06 MED ORDER — ORAL CARE MOUTH RINSE: 15.0000 mL | Freq: Once | OROMUCOSAL | Status: AC

## 2024-11-06 MED ORDER — VANCOMYCIN HCL 1000 MG IV SOLR
INTRAVENOUS | Status: AC
  Filled 2024-11-06: qty 20

## 2024-11-06 MED ORDER — ONDANSETRON HCL 4 MG/2ML IJ SOLN
INTRAMUSCULAR | Status: AC
  Filled 2024-11-06: qty 2

## 2024-11-06 MED ORDER — LACTATED RINGERS IV SOLN: INTRAVENOUS | Status: DC

## 2024-11-06 MED ORDER — HYDROMORPHONE HCL 1 MG/ML IJ SOLN
INTRAMUSCULAR | Status: DC | PRN
  Administered 2024-11-06: 11:00:00 .5 mg via INTRAVENOUS

## 2024-11-06 MED ORDER — DEXAMETHASONE SOD PHOSPHATE PF 10 MG/ML IJ SOLN
INTRAMUSCULAR | Status: DC | PRN
  Administered 2024-11-06: 09:00:00 10 mg via INTRAVENOUS

## 2024-11-06 MED ORDER — CHLORHEXIDINE GLUCONATE 0.12 % MT SOLN
15.0000 mL | Freq: Once | OROMUCOSAL | Status: AC
  Administered 2024-11-06: 08:00:00 15 mL via OROMUCOSAL
  Filled 2024-11-06: qty 15

## 2024-11-06 MED ORDER — HYDROMORPHONE HCL 1 MG/ML IJ SOLN
INTRAMUSCULAR | Status: AC
  Filled 2024-11-06: qty 0.5

## 2024-11-06 MED ORDER — CEFAZOLIN SODIUM-DEXTROSE 2-4 GM/100ML-% IV SOLN
INTRAVENOUS | Status: AC
  Filled 2024-11-06: qty 100

## 2024-11-06 MED ORDER — PROPOFOL 10 MG/ML IV BOLUS
INTRAVENOUS | Status: DC | PRN
  Administered 2024-11-06: 08:00:00 50 mg via INTRAVENOUS
  Administered 2024-11-06: 08:00:00 100 mg via INTRAVENOUS

## 2024-11-06 MED ORDER — ACETAMINOPHEN 500 MG PO TABS
500.0000 mg | ORAL_TABLET | Freq: Three times a day (TID) | ORAL | 0 refills | Status: AC | PRN
  Filled 2024-11-06: qty 60, 10d supply, fill #0

## 2024-11-06 MED ORDER — LIDOCAINE 2% (20 MG/ML) 5 ML SYRINGE
INTRAMUSCULAR | Status: DC | PRN
  Administered 2024-11-06: 08:00:00 100 mg via INTRAVENOUS

## 2024-11-06 MED ORDER — ROCURONIUM BROMIDE 10 MG/ML (PF) SYRINGE
PREFILLED_SYRINGE | INTRAVENOUS | Status: DC | PRN
  Administered 2024-11-06: 08:00:00 50 mg via INTRAVENOUS
  Administered 2024-11-06: 09:00:00 20 mg via INTRAVENOUS
  Administered 2024-11-06: 10:00:00 30 mg via INTRAVENOUS
  Administered 2024-11-06: 10:00:00 10 mg via INTRAVENOUS
  Administered 2024-11-06: 09:00:00 30 mg via INTRAVENOUS

## 2024-11-06 MED ADMIN — Fentanyl Citrate Preservative Free (PF) Inj 100 MCG/2ML: 50 ug | INTRAVENOUS | @ 08:00:00 | NDC 72572017001

## 2024-11-06 MED ADMIN — Vancomycin HCl For Inj 1000 MG: 1000 mg | @ 09:00:00 | NDC 16714030910

## 2024-11-06 MED ADMIN — Fentanyl Citrate Preservative Free (PF) Inj 100 MCG/2ML: 50 ug | INTRAVENOUS | @ 09:00:00 | NDC 72572017001

## 2024-11-06 MED ADMIN — Tobramycin Sulfate For Inj 1.2 GM: 1.2 g | @ 09:00:00 | NDC 72266016306

## 2024-11-06 MED ADMIN — Methylene Blue IV Soln Prefilled Syringe 20 MG/2ML (1%): 2 mL | @ 10:00:00 | NDC 54288015901

## 2024-11-06 MED ADMIN — Propofol IV Emul 500 MG/50ML (10 MG/ML): 35 ug/kg/min | INTRAVENOUS | @ 08:00:00 | NDC 00069023420

## 2024-11-06 MED FILL — Fentanyl Citrate Preservative Free (PF) Inj 100 MCG/2ML: INTRAMUSCULAR | Qty: 2 | Status: AC

## 2024-11-06 SURGICAL SUPPLY — 4 items
NDL HYPO 25GX1X1/2 BEV (NEEDLE) IMPLANT
PLATE EVOS STRENGTH 10H IMPLANT
PLATE OLECRANON W/TNS 10H LT IMPLANT
SCREW CTX 2X18 IMPLANT

## 2024-11-06 NOTE — Progress Notes (Signed)
 Patient transported to CT from PACU.

## 2024-11-06 NOTE — Discharge Instructions (Addendum)
 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  Sling for comfort, splint is to remain on until follow-up  RANGE OF MOTION/ACTIVITY: Okay to move fingers, wrist and shoulder.  You are splinted so no elbow motion.  Sling for comfort  Bone health: Labs show some vitamin D  insufficiency.  Recommend taking vitamin D3 5000 IUs daily  Review the following resource for additional information regarding bone health  [email or web address]  Wound Care: Do not remove splint.  Keep splint clean and dry.  Will remove splint at your first postoperative visit   Diet: as you were eating previously.  Can use over the counter stool softeners and bowel preparations, such as Miralax , to help with bowel movements.  Narcotics can be constipating.  Be sure to drink plenty of fluids  PAIN MEDICATION USE AND EXPECTATIONS  You have likely been given narcotic medications to help control your pain.  After a traumatic event that results in an fracture (broken bone) with or without surgery, it is ok to use narcotic pain medications to help control one's pain.  We understand that everyone responds to pain differently and each individual patient will be evaluated on a regular basis for the continued need for narcotic medications. Ideally, narcotic medication use should last no more than 6-8 weeks (coinciding with fracture healing).   As a patient it is your responsibility as well to monitor narcotic medication use and report the amount and frequency you use these medications when you come to your office visit.   We would also advise that if you are using narcotic medications, you should take a dose prior to therapy to maximize you  participation.  IF YOU ARE ON NARCOTIC MEDICATIONS IT IS NOT PERMISSIBLE TO OPERATE A MOTOR VEHICLE (MOTORCYCLE/CAR/TRUCK/MOPED) OR HEAVY MACHINERY DO NOT MIX NARCOTICS WITH OTHER CNS (CENTRAL NERVOUS SYSTEM) DEPRESSANTS SUCH AS ALCOHOL   POST-OPERATIVE OPIOID TAPER INSTRUCTIONS: It is important to wean off of your opioid medication as soon as possible. If you do not need pain medication after your surgery it is ok to stop day one. Opioids include: Codeine, Hydrocodone (Norco, Vicodin), Oxycodone (Percocet, oxycontin ) and hydromorphone  amongst others.  Long term and even short term use of opiods can cause: Increased pain response Dependence Constipation Depression Respiratory depression And more.  Withdrawal symptoms can include Flu like symptoms Nausea, vomiting And more Techniques to manage these symptoms Hydrate well Eat regular healthy meals Stay active Use relaxation techniques(deep breathing, meditating, yoga) Do Not substitute Alcohol to help with tapering If you have been on opioids for less than two weeks and do not have pain than it is ok to stop all together.  Plan to wean off of opioids This plan should start within one week post op of your fracture surgery  Maintain the same interval or time between taking each dose and first decrease the dose.  Cut the total daily intake of opioids by one tablet each day Next start to increase the time between doses. The last dose that should be eliminated is the evening dose.    STOP SMOKING OR USING NICOTINE PRODUCTS!!!!  As discussed nicotine severely impairs your body's ability to heal surgical and traumatic wounds but also impairs bone healing.  Wounds and bone heal by forming microscopic blood vessels (angiogenesis) and nicotine is a vasoconstrictor (  essentially, shrinks blood vessels).  Therefore, if vasoconstriction occurs to these microscopic blood vessels they essentially disappear and are unable to deliver necessary  nutrients to the healing tissue.  This is one modifiable factor that you can do to dramatically increase your chances of healing your injury.    (This means no smoking, no nicotine gum, patches, etc)  DO NOT USE NONSTEROIDAL ANTI-INFLAMMATORY DRUGS (NSAID'S)  Using products such as Advil  (ibuprofen ), Aleve  (naproxen ), Motrin  (ibuprofen ) for additional pain control during fracture healing can delay and/or prevent the healing response.  If you would like to take over the counter (OTC) medication, Tylenol  (acetaminophen ) is ok.  However, some narcotic medications that are given for pain control contain acetaminophen  as well. Therefore, you should not exceed more than 4000 mg of tylenol  in a day if you do not have liver disease.  Also note that there are may OTC medicines, such as cold medicines and allergy medicines that my contain tylenol  as well.  If you have any questions about medications and/or interactions please ask your doctor/PA or your pharmacist.      ICE AND ELEVATE INJURED/OPERATIVE EXTREMITY  Using ice and elevating the injured extremity above your heart can help with swelling and pain control.  Icing in a pulsatile fashion, such as 20 minutes on and 20 minutes off, can be followed.    Do not place ice directly on skin. Make sure there is a barrier between to skin and the ice pack.    Using frozen items such as frozen peas works well as the conform nicely to the are that needs to be iced.  USE AN ACE WRAP OR TED HOSE FOR SWELLING CONTROL  In addition to icing and elevation, Ace wraps or TED hose are used to help limit and resolve swelling.  It is recommended to use Ace wraps or TED hose until you are informed to stop.    When using Ace Wraps start the wrapping distally (farthest away from the body) and wrap proximally (closer to the body)   Example: If you had surgery on your leg and you do not have a splint on, start the ace wrap at the toes and work your way up to the thigh        If you  had surgery on your upper extremity and do not have a splint on, start the ace wrap at your fingers and work your way up to the upper arm  IF YOU ARE IN A SPLINT OR CAST DO NOT REMOVE IT FOR ANY REASON   If your splint gets wet for any reason please contact the office immediately. You may shower in your splint or cast as long as you keep it dry.  This can be done by wrapping in a cast cover or garbage back (or similar)  Do Not stick any thing down your splint or cast such as pencils, money, or hangers to try and scratch yourself with.  If you feel itchy take benadryl  as prescribed on the bottle for itching  IF YOU ARE IN A CAM BOOT (BLACK BOOT)  You may remove boot periodically. Perform daily dressing changes as noted below.  Wash the liner of the boot regularly and wear a sock when wearing the boot. It is recommended that you sleep in the boot until told otherwise    Call office for the following: Temperature greater than 101F Persistent nausea and vomiting Severe uncontrolled pain Redness, tenderness, or signs of infection (pain, swelling, redness, odor or  green/yellow discharge around the site) Difficulty breathing, headache or visual disturbances Hives Persistent dizziness or light-headedness Extreme fatigue Any other questions or concerns you may have after discharge  In an emergency, call 911 or go to an Emergency Department at a nearby hospital  HELPFUL INFORMATION  If you had a block, it will wear off between 8-24 hrs postop typically.  This is period when your pain may go from nearly zero to the pain you would have had postop without the block.  This is an abrupt transition but nothing dangerous is happening.  You may take an extra dose of narcotic when this happens.  You should wean off your narcotic medicines as soon as you are able.  Most patients will be off or using minimal narcotics before their first postop appointment.   We suggest you use the pain medication the first  night prior to going to bed, in order to ease any pain when the anesthesia wears off. You should avoid taking pain medications on an empty stomach as it will make you nauseous.  Do not drink alcoholic beverages or take illicit drugs when taking pain medications.  In most states it is against the law to drive while you are in a splint or sling.  And certainly against the law to drive while taking narcotics.  You may return to work/school in the next couple of days when you feel up to it.   Pain medication may make you constipated.  Below are a few solutions to try in this order: Decrease the amount of pain medication if you arent having pain. Drink lots of decaffeinated fluids. Drink prune juice and/or each dried prunes  If the first 3 dont work start with additional solutions Take Colace - an over-the-counter stool softener Take Senokot - an over-the-counter laxative Take Miralax  - a stronger over-the-counter laxative     CALL THE OFFICE WITH ANY QUESTIONS OR CONCERNS: [phone]   VISIT OUR WEBSITE FOR ADDITIONAL INFORMATION: [email or web address]

## 2024-11-06 NOTE — Transfer of Care (Signed)
 Immediate Anesthesia Transfer of Care Note  Patient: [name]  Procedure(s) Performed: REPAIR, FRACTURE NONUNION, ULNA (Left)  Patient Location: PACU  Anesthesia Type:General  Level of Consciousness: drowsy and responds to stimulation  Airway & Oxygen Therapy: Patient Spontanous Breathing  Post-op Assessment: Report given to RN and Post -op Vital signs reviewed and stable  Post vital signs: Reviewed and stable  Last Vitals:  Vitals Value Taken Time  BP 132/91 11/06/24 11:18  Temp 36.5 C 11/06/24 11:18  Pulse 101 11/06/24 11:26  Resp 13 11/06/24 11:26  SpO2 100 % 11/06/24 11:26  Vitals shown include unfiled device data.  Last Pain:  Vitals:   11/06/24 1118  TempSrc:   PainSc: Asleep      Patients Stated Pain Goal: 0 (11/06/24 0700)  Complications: No notable events documented.

## 2024-11-06 NOTE — Consult Note (Signed)
 Reason for Consult:abdominal mass Referring Physician: [name] is an 38 y.o. female.  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 She is in the OR with Dr. Celena for repair. He noted a large abdominal mass and asked me to see her. She is under general anesthesia so I cannot obtain HX from her.  Past Medical History:  Diagnosis Date   Abnormality of gait    Gait abnormality    Vitamin D  insufficiency 10/12/2023    Past Surgical History:  Procedure Laterality Date   HARDWARE REMOVAL Left 10/09/2023   Procedure: HARDWARE REMOVAL;  Surgeon: Celena Sharper, MD;  Location: [facility] OR;  Service: Orthopedics;  Laterality: Left;   ORIF ULNAR FRACTURE Left 06/13/2016   Procedure: OPEN REDUCTION INTERNAL FIXATION (ORIF) LEFT RADIUS AND ULNA FRACTURES;  Surgeon: Lonni CINDERELLA Poli, MD;  Location: [facility] OR;  Service: Orthopedics;  Laterality: Left;   ORIF ULNAR FRACTURE Left 06/09/2017   Procedure: OPEN REDUCTION INTERNAL FIXATION (ORIF) ULNAR FRACTURE INCISION AND DRAINAGE EXAM UNDER ANESTHESIA RADIAL HEAD;  Surgeon: Lucilla Lynwood FORBES, MD;  Location: [facility] ORS;  Service: Orthopedics;  Laterality: Left;   ORIF ULNAR FRACTURE Left 10/09/2023   Procedure: NONUNION REPAIR ULNA WITH ILIAC CREST BONE GRAFTING;  Surgeon: Celena Sharper, MD;  Location: [facility] OR;  Service: Orthopedics;  Laterality: Left;    Family History  Problem Relation Age of Onset   Hypertension Mother    Diabetes Mother     Social History:  reports that she has never smoked. She has never used smokeless tobacco. She reports current alcohol use of about 1.0 standard drink of alcohol per week. She reports that she does not use drugs.  Allergies: Allergies[1]  Medications: I have reviewed the patient's current medications.  Results for orders placed or performed during the hospital encounter of 11/06/24 (from the past 48 hours)  TSH     Status: None   Collection Time: 11/06/24  6:23 AM  Result Value Ref  Range   TSH 3.860 0.350 - 4.500 uIU/mL    Comment: Performed at [facility], [address], [place], [place] [address]  Hemoglobin A1c     Status: None   Collection Time: 11/06/24  6:23 AM  Result Value Ref Range   Hgb A1c MFr Bld 5.3 4.8 - 5.6 %    Comment: (NOTE) Diagnosis of Diabetes The following HbA1c ranges recommended by the American Diabetes Association (ADA) may be used as an aid in the diagnosis of diabetes mellitus.  Hemoglobin             Suggested A1C NGSP%              Diagnosis  <5.7                   Non Diabetic  5.7-6.4                Pre-Diabetic  >6.4                   Diabetic  <7.0                   Glycemic control for                       adults with diabetes.     Mean Plasma Glucose 105.41 mg/dL    Comment: Performed at [facility], [address], [place], [place]  [address]  Pregnancy, urine POC     Status: None   Collection Time: 11/06/24  6:55 AM  Result Value Ref Range   Preg Test, Ur NEGATIVE NEGATIVE    Comment:        THE SENSITIVITY OF THIS METHODOLOGY IS >20 mIU/mL.   Urinalysis, Routine w reflex microscopic -Urine, Clean Catch     Status: Abnormal   Collection Time: 11/06/24  6:58 AM  Result Value Ref Range   Color, Urine YELLOW YELLOW   APPearance HAZY (A) CLEAR   Specific Gravity, Urine 1.019 1.005 - 1.030   pH 5.0 5.0 - 8.0   Glucose, UA NEGATIVE NEGATIVE mg/dL   Hgb urine dipstick NEGATIVE NEGATIVE   Bilirubin Urine NEGATIVE NEGATIVE   Ketones, ur NEGATIVE NEGATIVE mg/dL   Protein, ur NEGATIVE NEGATIVE mg/dL   Nitrite NEGATIVE NEGATIVE   Leukocytes,Ua MODERATE (A) NEGATIVE   RBC / HPF 0-5 0 - 5 RBC/hpf   WBC, UA 6-10 0 - 5 WBC/hpf   Bacteria, UA RARE (A) NONE SEEN   Squamous Epithelial / HPF 6-10 0 - 5 /HPF   Mucus PRESENT     Comment: Performed at [facility], [address], [place], [place] [address]  Urine Drug Screen     Status: None   Collection Time: 11/06/24  6:59 AM  Result Value Ref Range    Opiates NEGATIVE NEGATIVE   Cocaine NEGATIVE NEGATIVE   Benzodiazepines NEGATIVE NEGATIVE   Amphetamines NEGATIVE NEGATIVE   Tetrahydrocannabinol NEGATIVE NEGATIVE   Barbiturates NEGATIVE NEGATIVE   Methadone Scn, Ur NEGATIVE NEGATIVE   Fentanyl  NEGATIVE NEGATIVE    Comment: (NOTE) Drug screen is for Medical Purposes only. Positive results are preliminary only. If confirmation is needed, notify lab within 5 days.  Drug Class                 Cutoff (ng/mL) Amphetamine and metabolites 1000 Barbiturate and metabolites 200 Benzodiazepine              200 Opiates and metabolites     300 Cocaine and metabolites     300 THC                         50 Fentanyl                     5 Methadone                   300  Trazodone is metabolized in vivo to several metabolites,  including pharmacologically active m-CPP, which is excreted in the  urine.  Immunoassay screens for amphetamines and MDMA have potential  cross-reactivity with these compounds and may provide false positive  result.  Performed at [facility], [address] [place], [place] [address]   CBC WITH DIFFERENTIAL     Status: Abnormal   Collection Time: 11/06/24  7:12 AM  Result Value Ref Range   WBC 7.3 4.0 - 10.5 K/uL   RBC 4.50 3.87 - 5.11 MIL/uL   Hemoglobin 11.3 (L) 12.0 - 15.0 g/dL   HCT 64.1 (L) 63.9 - 53.9 %   MCV 79.6 (L) 80.0 - 100.0 fL   MCH 25.1 (L) 26.0 - 34.0 pg   MCHC 31.6 30.0 - 36.0 g/dL   RDW 83.6 (H) 88.4 - 84.4 %   Platelets 225 150 - 400 K/uL   nRBC 0.0 0.0 - 0.2 %   Neutrophils Relative %  66 %   Neutro Abs 4.9 1.7 - 7.7 K/uL   Lymphocytes Relative 23 %   Lymphs Abs 1.7 0.7 - 4.0 K/uL   Monocytes Relative 7 %   Monocytes Absolute 0.5 0.1 - 1.0 K/uL   Eosinophils Relative 3 %   Eosinophils Absolute 0.2 0.0 - 0.5 K/uL   Basophils Relative 1 %   Basophils Absolute 0.1 0.0 - 0.1 K/uL   Immature Granulocytes 0 %   Abs Immature Granulocytes 0.03 0.00 - 0.07 K/uL    Comment:  Performed at [facility], [address], [place], [place] [address]  Comprehensive metabolic panel     Status: Abnormal   Collection Time: 11/06/24  7:12 AM  Result Value Ref Range   Sodium 138 135 - 145 mmol/L   Potassium 4.0 3.5 - 5.1 mmol/L   Chloride 106 98 - 111 mmol/L   CO2 22 22 - 32 mmol/L   Glucose, Bld 94 70 - 99 mg/dL    Comment: Glucose reference range applies only to samples taken after fasting for at least 8 hours.   BUN 13 6 - 20 mg/dL   Creatinine, Ser 9.29 0.44 - 1.00 mg/dL   Calcium 8.8 (L) 8.9 - 10.3 mg/dL   Total Protein 7.2 6.5 - 8.1 g/dL   Albumin  4.4 3.5 - 5.0 g/dL   AST 24 15 - 41 U/L   ALT 11 0 - 44 U/L   Alkaline Phosphatase 60 38 - 126 U/L   Total Bilirubin 0.8 0.0 - 1.2 mg/dL   GFR, Estimated >39 >39 mL/min    Comment: (NOTE) Calculated using the CKD-EPI Creatinine Equation (2021)    Anion gap 9 5 - 15    Comment: Performed at [facility], [address] [place], [place] [address]  C-reactive protein     Status: Abnormal   Collection Time: 11/06/24  7:12 AM  Result Value Ref Range   CRP <3.0 (H) <1.0 mg/dL    Comment: Performed at [facility], [address], [place], [place] [address]  Magnesium     Status: None   Collection Time: 11/06/24  7:12 AM  Result Value Ref Range   Magnesium 2.0 1.7 - 2.4 mg/dL    Comment: Performed at [facility], [address] [place], [place] [address]  Phosphorus     Status: None   Collection Time: 11/06/24  7:12 AM  Result Value Ref Range   Phosphorus 2.7 2.5 - 4.6 mg/dL    Comment: Performed at [facility], [address] [place], [place] [address]  Prealbumin     Status: Abnormal   Collection Time: 11/06/24  7:12 AM  Result Value Ref Range   Prealbumin 17 (L) 18 - 38 mg/dL    Comment: Performed at [facility], [address] [place], [place] [address]    DG FEMUR PORT MIN 2 VIEWS LEFT Result Date: 11/06/2024 CLINICAL DATA:  [id number] Pre-op exam [id number] EXAM: LEFT  FEMUR PORTABLE 2 VIEWS COMPARISON:  None Available. FINDINGS: No acute fracture or dislocation. Joint spaces and alignment are relatively maintained. No area of erosion or osseous destruction. No unexpected radiopaque foreign body. Soft tissues are unremarkable. IMPRESSION: No acute fracture or dislocation. If there is a persistent clinical concern for nondisplaced hip or pelvic fracture, recommend dedicated pelvic CT or MRI. Electronically Signed   By: Corean Salter M.D.   On: 11/06/2024 07:52    Review of Systems  Unable to perform ROS: Intubated   Blood pressure 138/67, pulse (!) 110, temperature 99.4 F (37.4 C), temperature source Oral, resp. rate 18, height 5' 4 (1.626 m), weight 67.6 kg, last menstrual period 10/14/2024, SpO2 97%. Physical Exam HENT:     Mouth/Throat:     Comments: Oral ETT Abdominal:     Comments: Soft, large firm abdominal mass extends from above her pubis to the upper abdomen approximately 20cm. I did a limited U/S and it appears solid.   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 Dr. Celena plans to admit post-op and will order the scan. We will follow-up.  Karla Nichols 11/06/2024, 8:44 AM         [1] No Known Allergies

## 2024-11-06 NOTE — Anesthesia Procedure Notes (Signed)
 Procedure Name: Intubation Date/Time: 11/06/2024 8:11 AM  Performed by: Mannie Krystal LABOR, CRNAPre-anesthesia Checklist: Patient identified, Emergency Drugs available, Suction available and Patient being monitored Patient Re-evaluated:Patient Re-evaluated prior to induction Oxygen Delivery Method: Circle system utilized Preoxygenation: Pre-oxygenation with 100% oxygen Induction Type: IV induction Ventilation: Mask ventilation without difficulty Laryngoscope Size: Miller and 2 Grade View: Grade I Tube type: Oral Tube size: 7.0 mm Number of attempts: 1 Airway Equipment and Method: Stylet Placement Confirmation: ETT inserted through vocal cords under direct vision, positive ETCO2 and breath sounds checked- equal and bilateral Secured at: 22 cm Tube secured with: Tape Dental Injury: Teeth and Oropharynx as per pre-operative assessment

## 2024-11-06 NOTE — Anesthesia Preprocedure Evaluation (Signed)
 Anesthesia Evaluation  Patient identified by MRN, date of birth, ID band Patient awake    Reviewed: Allergy & Precautions, NPO status , Patient's Chart, lab work & pertinent test results  History of Anesthesia Complications Negative for: history of anesthetic complications  Airway Mallampati: II  TM Distance: >3 FB Neck ROM: Full    Dental  (+) Teeth Intact, Dental Advisory Given   Pulmonary    breath sounds clear to auscultation       Cardiovascular negative cardio ROS  Rhythm:Regular Rate:Normal     Neuro/Psych  Cerebellar Ataxia c/b Falls    GI/Hepatic negative GI ROS,,,  Endo/Other    Renal/GU      Musculoskeletal Chronic nonunion of L ulna    Abdominal   Peds  Hematology   Anesthesia Other Findings   Reproductive/Obstetrics                              Anesthesia Physical Anesthesia Plan  ASA: 2  Anesthesia Plan: General   Post-op Pain Management:    Induction: Intravenous  PONV Risk Score and Plan: 3 and Ondansetron , Dexamethasone  and Treatment may vary due to age or medical condition  Airway Management Planned: Oral ETT  Additional Equipment: None  Intra-op Plan:   Post-operative Plan: Extubation in OR  Informed Consent:      Dental advisory given  Plan Discussed with: CRNA  Anesthesia Plan Comments:          Anesthesia Quick Evaluation

## 2024-11-07 ENCOUNTER — Encounter (HOSPITAL_COMMUNITY): Payer: Self-pay | Admitting: Orthopedic Surgery

## 2024-11-07 LAB — PTH, INTACT AND CALCIUM
Calcium, Total (PTH): 8.6 mg/dL — ABNORMAL LOW (ref 8.7–10.2)
PTH: 30 pg/mL (ref 15–65)

## 2024-11-07 LAB — CALCITRIOL (1,25 DI-OH VIT D): Vit D, 1,25-Dihydroxy: 24.9 pg/mL (ref 24.8–81.5)

## 2024-11-08 LAB — CALCIUM, IONIZED: Calcium, Ionized, Serum: 4.7 mg/dL (ref 4.5–5.6)

## 2024-11-10 NOTE — Anesthesia Postprocedure Evaluation (Signed)
"   Anesthesia Post Note  Patient: Karla Nichols  Procedure(s) Performed: REPAIR, FRACTURE NONUNION, ULNA (Left)     Patient location during evaluation: PACU Anesthesia Type: General Level of consciousness: awake Pain management: pain level controlled Vital Signs Assessment: post-procedure vital signs reviewed and stable Respiratory status: spontaneous breathing Cardiovascular status: blood pressure returned to baseline and stable Postop Assessment: no apparent nausea or vomiting Anesthetic complications: no   No notable events documented.  Last Vitals:  Vitals:   11/06/24 1400 11/06/24 1415  BP: (!) 133/90 (!) 132/92  Pulse: (!) 105 99  Resp: 15 (!) 9  Temp:  36.5 C  SpO2: 94% 95%    Last Pain:  Vitals:   11/06/24 1428  TempSrc:   PainSc: 4                  Mekai Wilkinson T Colhoun      "

## 2024-11-11 LAB — AEROBIC/ANAEROBIC CULTURE W GRAM STAIN (SURGICAL/DEEP WOUND)
Culture: NO GROWTH
Culture: NO GROWTH
Gram Stain: NONE SEEN
Gram Stain: NONE SEEN

## 2024-11-14 ENCOUNTER — Encounter: Payer: Self-pay | Admitting: Psychiatry

## 2024-11-17 ENCOUNTER — Ambulatory Visit: Payer: Self-pay | Admitting: Psychiatry

## 2024-11-17 ENCOUNTER — Inpatient Hospital Stay: Attending: Psychiatry | Admitting: Psychiatry

## 2024-11-17 ENCOUNTER — Encounter: Payer: Self-pay | Admitting: Psychiatry

## 2024-11-17 ENCOUNTER — Ambulatory Visit (HOSPITAL_COMMUNITY)
Admission: RE | Admit: 2024-11-17 | Discharge: 2024-11-17 | Disposition: A | Discharge status: Home / Self Care | Source: Ambulatory Visit | Attending: Psychiatry | Admitting: Psychiatry

## 2024-11-17 ENCOUNTER — Inpatient Hospital Stay

## 2024-11-17 VITALS — BP 135/83 | HR 95 | Temp 99.4°F | Resp 18 | Ht 64.0 in | Wt 149.6 lb

## 2024-11-17 DIAGNOSIS — Z01818 Encounter for other preprocedural examination: Secondary | ICD-10-CM | POA: Insufficient documentation

## 2024-11-17 DIAGNOSIS — Z90710 Acquired absence of both cervix and uterus: Secondary | ICD-10-CM | POA: Insufficient documentation

## 2024-11-17 DIAGNOSIS — Z79899 Other long term (current) drug therapy: Secondary | ICD-10-CM | POA: Insufficient documentation

## 2024-11-17 DIAGNOSIS — Z833 Family history of diabetes mellitus: Secondary | ICD-10-CM | POA: Diagnosis not present

## 2024-11-17 DIAGNOSIS — N134 Hydroureter: Secondary | ICD-10-CM | POA: Insufficient documentation

## 2024-11-17 DIAGNOSIS — R1900 Intra-abdominal and pelvic swelling, mass and lump, unspecified site: Secondary | ICD-10-CM | POA: Insufficient documentation

## 2024-11-17 DIAGNOSIS — Z8041 Family history of malignant neoplasm of ovary: Secondary | ICD-10-CM | POA: Diagnosis not present

## 2024-11-17 DIAGNOSIS — Z8249 Family history of ischemic heart disease and other diseases of the circulatory system: Secondary | ICD-10-CM | POA: Insufficient documentation

## 2024-11-17 LAB — LACTATE DEHYDROGENASE: LDH: 171 U/L (ref 105–235)

## 2024-11-17 NOTE — Patient Instructions (Signed)
 It was a pleasure to see you in clinic today. - We discussed that this could either be coming from your ovary or uterus.  In either case this could be a benign or malignant process.  In order to further evaluate this I have recommended labs today and as well for you to get an MRI and chest x-ray. -I also collected a Pap smear today. - I have referred you to the genetics team given your mom's history of ovarian cancer. - Return visit planned for after completion of your workup to discuss the results and make a final plan for surgery  Thank you very much for allowing me to provide care for you today.  I appreciate your confidence in choosing our Gynecologic Oncology team at [facility].  If you have any questions about your visit today please call our office or send us  a MyChart message and we will get back to you as soon as possible.

## 2024-11-17 NOTE — Progress Notes (Signed)
 GYNECOLOGIC ONCOLOGY NEW PATIENT CONSULTATION  Date of Service: 11/17/2024 Referring Provider: Donnice Bury, MD   ASSESSMENT AND PLAN: Karla Nichols is a 38 y.o. woman with 19 cm solid abdominal pelvic mass.  We reviewed that the exact etiology of the pelvic mass is unclear, but could include a benign, borderline, or malignant process.  Also reviewed that the mass could either be arising from the uterus or the ovary but this is not entirely clear at this time.  The recommended treatment is surgical excision to make a definitive diagnosis.  Because the mass is large, laparotomy would be required to remove the mass.  Prior to surgery, however, I do recommend some additional workup.  Recommend tumor markers with a CA125, CEA, CA 19-9, AFP, LDH, hCG, inhibin B.  Also recommend MRI abdomen/pelvis to further characterize mass and potentially better determine the origin of the mass of either the ovary or the uterus.  Given that sarcoma is on the possible differential, we will also obtain a chest x-ray to evaluate for any obvious pulmonary abnormality.  Reviewed that if there is any abnormality or indeterminate finding on the chest x-ray, we would then consider a CT chest.  The CT abdomen/pelvis did not show any lymphadenopathy or peritoneal findings.  Reviewed in general with surgery in detail.  Reviewed that this would partially be further determined by the workup as above.  Surgery might entail oophorectomy and or hysterectomy.  Reviewed the possibility of fertility sparing staging.  However, patient does not desire future fertility and is accepting of hysterectomy if needed.  Reviewed the value of retaining ovaries if possible given her young age.  1 dose of Ativan sent for the MRI.  Patient reports that she has never had a Pap smear.  Reports that she has been sexually active in the past with a female partner.  Pap smear collected today.  Given family history of ovarian cancer, referral placed to  genetics.   RTC following workup as above to finalize surgical plan.   A copy of this note was sent to the patient's referring provider.  Hoy Masters, MD Gynecologic Oncology   Medical Decision Making I personally spent  TOTAL 55 minutes face-to-face and non-face-to-face in the care of this patient, which includes all pre, intra, and post visit time on the date of service.   ------------  CC: Abdominal mass  HISTORY OF PRESENT ILLNESS:  Karla Nichols is a 38 y.o. woman who is seen in consultation at the request of Donnice Bury, MD for evaluation of an abdominal mass.  Patient was taken to the operating room by Dr. Celena on 11/06/2024 for left ulnar nonunion repair.  In the operating room she was noted to have a large abdominal mass and general surgery was consulted.  She was seen by Dr. Sebastian who noted an approximate 20 cm abdominal mass arising from the level above the pubis to her upper abdomen.  The limited intraoperative ultrasound was performed and noted the mass to be solid.  A CT scan was recommended.  She then underwent a CT abdomen/pelvis on 11/06/2024 which noted a mass measuring 18.3 x 12.9 x 19 cm favored to be arising from the uterus but cannot exclude right ovarian mass.  The scan noted that the mass was uniform, enhancing, and solid.  The mass is well-circumscribed and may represent a large intramural leiomyoma or leiomyosarcoma given its size per the report.  Small amount of free fluid was noted and no lymphadenopathy.  Today  patient presents along with her friend.  She reports that maybe about a year ago she started to notice that her abdomen was a little bit bigger but thought that maybe she was just getting fat.  Also notes gas pain, bloating, and some tightness but denies pain otherwise.  She otherwise denies early satiety, significant weight loss, change in bowel or bladder habits, chest pain, cough, or shortness of breath.   Has follow-up with Dr. Celena  on Wednesday. Has a splint in place on her left arm.   PAST MEDICAL HISTORY: Past Medical History:  Diagnosis Date   Abnormality of gait    Gait abnormality    Vitamin D  insufficiency 10/12/2023    PAST SURGICAL HISTORY: Past Surgical History:  Procedure Laterality Date   HARDWARE REMOVAL Left 10/09/2023   Procedure: HARDWARE REMOVAL;  Surgeon: Celena Sharper, MD;  Location: [facility] OR;  Service: Orthopedics;  Laterality: Left;   ORIF ULNAR FRACTURE Left 06/13/2016   Procedure: OPEN REDUCTION INTERNAL FIXATION (ORIF) LEFT RADIUS AND ULNA FRACTURES;  Surgeon: Lonni CINDERELLA Poli, MD;  Location: [facility] OR;  Service: Orthopedics;  Laterality: Left;   ORIF ULNAR FRACTURE Left 06/09/2017   Procedure: OPEN REDUCTION INTERNAL FIXATION (ORIF) ULNAR FRACTURE INCISION AND DRAINAGE EXAM UNDER ANESTHESIA RADIAL HEAD;  Surgeon: Lucilla Lynwood BRAVO, MD;  Location: [facility] ORS;  Service: Orthopedics;  Laterality: Left;   ORIF ULNAR FRACTURE Left 10/09/2023   Procedure: NONUNION REPAIR ULNA WITH ILIAC CREST BONE GRAFTING;  Surgeon: Celena Sharper, MD;  Location: [facility] OR;  Service: Orthopedics;  Laterality: Left;   REPAIR NON-UNION ULNA Left 11/06/2024   Procedure: REPAIR, FRACTURE NONUNION, ULNA;  Surgeon: Celena Sharper, MD;  Location: [facility] OR;  Service: Orthopedics;  Laterality: Left;  LEFT RIA  (AUTOGRAFT)    OB/GYN HISTORY: OB History  No obstetric history on file.      Age at menarche: 68 Age at menopause: n/a Hx of HRT: no Hx of STI: no Last pap: none History of abnormal pap smears: n/a  SCREENING STUDIES:  Last mammogram: n/a Last colonoscopy: n/a  MEDICATIONS: Current Medications[1]  ALLERGIES: Allergies[2]  FAMILY HISTORY: Family History  Problem Relation Age of Onset   Hypertension Mother    Diabetes Mother    Ovarian cancer Mother     SOCIAL HISTORY: Social History   Socioeconomic History   Marital status: Single    Spouse name: Not on file   Number of children: 0   Years of  education: college 1   Highest education level: Not on file  Occupational History   Occupation: Lobbyist: [employer]  Tobacco Use   Smoking status: Never   Smokeless tobacco: Never  Vaping Use   Vaping status: Never Used  Substance and Sexual Activity   Alcohol use: Yes    Alcohol/week: 1.0 standard drink of alcohol    Types: 1 Standard drinks or equivalent per week    Comment: occassional   Drug use: No   Sexual activity: Yes    Birth control/protection: None  Other Topics Concern   Not on file  Social History Narrative   ** Merged History Encounter **       Social Drivers of Health   Tobacco Use: Low Risk (11/06/2024)   Patient History    Smoking Tobacco Use: Never    Smokeless Tobacco Use: Never    Passive Exposure: Not on file  Financial Resource Strain: Not on file  Food Insecurity: No Food Insecurity (10/09/2023)   Hunger  Vital Sign    Worried About Programme Researcher, Broadcasting/film/video in the Last Year: Never true    Ran Out of Food in the Last Year: Never true  Transportation Needs: No Transportation Needs (10/09/2023)   PRAPARE - Administrator, Civil Service (Medical): No    Lack of Transportation (Non-Medical): No  Physical Activity: Not on file  Stress: Not on file  Social Connections: Not on file  Intimate Partner Violence: Not At Risk (10/09/2023)   Humiliation, Afraid, Rape, and Kick questionnaire    Fear of Current or Ex-Partner: No    Emotionally Abused: No    Physically Abused: No    Sexually Abused: No  Depression (PHQ2-9): Not on file  Alcohol Screen: Not on file  Housing: Low Risk (10/09/2023)   Housing    Last Housing Risk Score: 0  Utilities: Not At Risk (10/09/2023)   AHC Utilities    Threatened with loss of utilities: No  Health Literacy: Not on file    REVIEW OF SYSTEMS: New patient intake form was reviewed.  Complete 10-system review is negative except for the following: Abdominal distention  PHYSICAL EXAM: BP 135/83  (BP Location: Right Arm, Patient Position: Sitting)   Pulse 95   Temp 99.4 F (37.4 C) (Oral)   Resp 18   Ht 5' 4 (1.626 m)   Wt 149 lb 9.6 oz (67.9 kg)   LMP 10/14/2024 (Exact Date)   SpO2 100%   BMI 25.68 kg/m  Constitutional: No acute distress. Neuro/Psych: Alert, oriented.  Head and Neck: Normocephalic, atraumatic. Neck symmetric without masses. Sclera anicteric.  Respiratory: Normal work of breathing. Clear to auscultation bilaterally. Cardiovascular: Regular rate and rhythm, no murmurs, rubs, or gallops. Abdomen: Normoactive bowel sounds.  Mass palpated in abdomen rising to above the level of the umbilicus and wide with mobility.  Extremities: Grossly normal range of motion. Warm, well perfused. No edema bilaterally. Skin: No rashes or lesions. Lymphatic:No cervical, supraclavicular, or inguinal adenopathy. Genitourinary: External genitalia without lesions. Urethral meatus without lesions or prolapse. On speculum exam, vagina and cervix without lesions.  Small mucus blood in vagina consistent with end of menses.  Pap smear collected.  Bimanual exam reveals normal cervix and possible mobile uterus that may suggest this may be arising from the right adnexa but difficult to discern.  No left adnexal mass.. Exam chaperoned by Darice Sanes, RN   LABORATORY AND RADIOLOGIC DATA: Outside medical records were reviewed to synthesize the above history, along with the history and physical obtained during the visit.  Outside laboratory, and imaging reports were reviewed, with pertinent results below.  I personally reviewed the outside images.  WBC  Date Value Ref Range Status  11/06/2024 7.3 4.0 - 10.5 K/uL Final   Hemoglobin  Date Value Ref Range Status  11/06/2024 11.3 (L) 12.0 - 15.0 g/dL Final   HCT  Date Value Ref Range Status  11/06/2024 35.8 (L) 36.0 - 46.0 % Final   Platelets  Date Value Ref Range Status  11/06/2024 225 150 - 400 K/uL Final   Magnesium  Date Value Ref Range  Status  11/06/2024 2.0 1.7 - 2.4 mg/dL Final    Comment:    Performed at [facility], [address] [place], [place] [address]   Creatinine, Ser  Date Value Ref Range Status  11/06/2024 0.70 0.44 - 1.00 mg/dL Final   AST  Date Value Ref Range Status  11/06/2024 24 15 - 41 U/L Final   ALT  Date Value Ref Range Status  11/06/2024 11 0 - 44 U/L Final    CT ABDOMEN PELVIS W CONTRAST 11/06/2024  Narrative EXAM: CT ABDOMEN AND PELVIS WITH CONTRAST 11/06/2024 02:46:00 PM  TECHNIQUE: CT of the abdomen and pelvis was performed with the administration of 60 mL of iohexol  (OMNIPAQUE ) 350 MG/ML injection. Multiplanar reformatted images are provided for review. Automated exposure control, iterative reconstruction, and/or weight-based adjustment of the mA/kV was utilized to reduce the radiation dose to as low as reasonably achievable.  COMPARISON: None available.  CLINICAL HISTORY: Abdominal mass, palpable (Ped 0-17y)  FINDINGS:  LOWER CHEST: The lung bases are clear.  LIVER: The liver is unremarkable.  GALLBLADDER AND BILE DUCTS: Gallbladder is unremarkable. No biliary ductal dilatation.  SPLEEN: No acute abnormality.  PANCREAS: No acute abnormality.  ADRENAL GLANDS: No acute abnormality.  KIDNEYS, URETERS AND BLADDER: Mild hydroureter and caliectasis of the right renal collecting system. Findings likely related to partial mechanical obstruction by the large intraabdominal mass arising from the pelvis. No stones in the kidneys or ureters. No perinephric or periureteral stranding. Urinary bladder is normal.  GI AND BOWEL: Stomach demonstrates no acute abnormality. There is no bowel obstruction. Appendix is not identified.  PERITONEUM AND RETROPERITONEUM: Small amount of free fluid in the pelvis. There is a large intraperitoneal mass which arises out of the pelvis with origin favored the uterus but cannot exclude a right ovarian mass. The mass measures  18.3 x 12.9 x 19.0 cm with a calculated volume of 3.2 L. The mass is uniform , enhancing, and solid. The mass is well circumscribed and may represent a large intramural leiomyoma or leiomyosarcoma given its size. Secondary differential would include a right ovarian mass. There is a rounded structure posterior to the uterus measuring 4 cm which may represent the left ovary on image 63/3.  VASCULATURE: Aorta is normal in caliber.  LYMPH NODES: No lymphadenopathy.  REPRODUCTIVE ORGANS: No tubal obstruction.  BONES AND SOFT TISSUES: No acute osseous abnormality. No focal soft tissue abnormality.  IMPRESSION: 1. Large intraperitoneal mass (18.3 x 12.9 x 19.0 cm, volume 3.2 L) arising from the pelvis, favored to originate from the uterus (differential includes large intramural leiomyoma or leiomyosarcoma given size) but cannot exclude a right ovarian mass. Recommend gynecology surgical consultation and further evaluation with MRI of the abdomen and pelvis and pelvic ultrasound to better assess presence of normal ovaries. 2. Mild right hydroureter and caliectasis related to partial mechanical obstruction by the large intraabdominal mass. 3. Small amount of free fluid in the pelvis.  Electronically signed by: Norleen Boxer MD 11/06/2024 03:26 PM EST RP Workstation: [id number]     [1]  Current Outpatient Medications:    acetaminophen  (TYLENOL ) 500 MG tablet, Take 1-2 tablets (500-1,000 mg total) by mouth every 8 (eight) hours as needed for mild pain (pain score 1-3) or moderate pain (pain score 4-6) (pain score 1-3 or temp > 100.5)., Disp: 60 tablet, Rfl: 0   acidophilus (RISAQUAD) CAPS capsule, Take 1 capsule by mouth daily., Disp: , Rfl:    Calcium Carb-Cholecalciferol  (CALTRATE 600+D3 PO), Take 1 tablet by mouth daily., Disp: , Rfl:    cetirizine (ZYRTEC) 10 MG tablet, Take 10 mg by mouth daily., Disp: , Rfl:    methocarbamol  (ROBAXIN ) 500 MG tablet, Take 1 tablet (500 mg total) by  mouth every 6 (six) hours as needed for muscle spasms., Disp: 30 tablet, Rfl: 0   oxyCODONE  (OXY IR/ROXICODONE ) 5 MG immediate release tablet, Take 1-2 tablets (5-10  mg total) by mouth every 6 (six) hours as needed for severe pain (pain score 7-10) (pain score 7-10)., Disp: 40 tablet, Rfl: 0 [2] No Known Allergies

## 2024-11-18 LAB — CANCER ANTIGEN 19-9: CA 19-9: 30 U/mL (ref 0–35)

## 2024-11-18 LAB — INHIBIN B: Inhibin B: 113.6 pg/mL

## 2024-11-18 LAB — CEA (ACCESS): CEA (CHCC): <1 ng/mL (ref 0.00–5.00)

## 2024-11-18 LAB — BETA HCG QUANT (REF LAB): hCG Quant: <1 m[IU]/mL

## 2024-11-18 LAB — CA 125: Cancer Antigen (CA) 125: 156 U/mL — ABNORMAL HIGH (ref 0.0–38.1)

## 2024-11-18 LAB — AFP TUMOR MARKER: AFP, Serum, Tumor Marker: <1.8 ng/mL (ref 0.0–6.4)

## 2024-11-19 ENCOUNTER — Inpatient Hospital Stay

## 2024-11-19 ENCOUNTER — Inpatient Hospital Stay: Admitting: Genetic Counselor

## 2024-11-21 ENCOUNTER — Ambulatory Visit (HOSPITAL_COMMUNITY)
Admission: RE | Admit: 2024-11-21 | Discharge: 2024-11-21 | Disposition: A | Discharge status: Home / Self Care | Source: Ambulatory Visit | Attending: Psychiatry | Admitting: Psychiatry

## 2024-11-21 DIAGNOSIS — R1900 Intra-abdominal and pelvic swelling, mass and lump, unspecified site: Secondary | ICD-10-CM | POA: Diagnosis present

## 2024-11-21 MED ORDER — GADOBUTROL 1 MMOL/ML IV SOLN
6.0000 mL | Freq: Once | INTRAVENOUS | Status: AC | PRN
  Administered 2024-11-21: 6 mL via INTRAVENOUS

## 2024-11-25 ENCOUNTER — Other Ambulatory Visit (HOSPITAL_COMMUNITY): Payer: Self-pay

## 2024-11-25 LAB — CYTOLOGY - PAP
Comment: NEGATIVE
Diagnosis: NEGATIVE
Diagnosis: REACTIVE
High risk HPV: NEGATIVE

## 2024-11-26 ENCOUNTER — Encounter (HOSPITAL_COMMUNITY): Payer: Self-pay | Admitting: Orthopedic Surgery

## 2024-11-27 ENCOUNTER — Inpatient Hospital Stay: Payer: Self-pay | Attending: Psychiatry | Admitting: Genetic Counselor

## 2024-11-27 ENCOUNTER — Inpatient Hospital Stay: Payer: Self-pay

## 2024-11-27 ENCOUNTER — Encounter: Payer: Self-pay | Admitting: Genetic Counselor

## 2024-11-27 DIAGNOSIS — Z8041 Family history of malignant neoplasm of ovary: Secondary | ICD-10-CM

## 2024-11-27 DIAGNOSIS — D251 Intramural leiomyoma of uterus: Secondary | ICD-10-CM | POA: Insufficient documentation

## 2024-11-27 DIAGNOSIS — D250 Submucous leiomyoma of uterus: Secondary | ICD-10-CM | POA: Insufficient documentation

## 2024-11-27 DIAGNOSIS — Z803 Family history of malignant neoplasm of breast: Secondary | ICD-10-CM

## 2024-11-27 DIAGNOSIS — R971 Elevated cancer antigen 125 [CA 125]: Secondary | ICD-10-CM | POA: Insufficient documentation

## 2024-11-27 DIAGNOSIS — E559 Vitamin D deficiency, unspecified: Secondary | ICD-10-CM | POA: Insufficient documentation

## 2024-11-27 LAB — GENETIC SCREENING ORDER

## 2024-11-27 NOTE — Progress Notes (Signed)
 REFERRING PROVIDER: Eldonna Mays, MD   PRIMARY PROVIDER:  Kip Righter, MD  PRIMARY REASON FOR VISIT:  1. Family history of malignant neoplasm of ovary   2. Family history of malignant neoplasm of breast      HISTORY OF PRESENT ILLNESS:   [name], a 39 y.o. female, was seen for a [place] cancer genetics consultation at the request of Eldonna Mays, MD due to a family history of cancer.  Karla Nichols presents to clinic today to discuss the possibility of a hereditary predisposition to cancer, to discuss genetic testing, and to further clarify her future cancer risks, as well as potential cancer risks for family members.   Karla Nichols is a 39 y.o. female with no personal history of cancer.    RELEVANT MEDICAL HISTORY:  Menarche was at age 50.  Nulliparous. Does not currently plan on having children, but wants to have the option.  Ovaries intact: yes.  Uterus intact: yes.  Menopausal status: premenopausal.  HRT use: 0 years. Colonoscopy: no; not examined. Mammogram within the last year: no. Number of breast biopsies: 0. Up to date with pelvic exams: yes.   Past Medical History:  Diagnosis Date   Abnormality of gait    Gait abnormality    Vitamin D  insufficiency 10/12/2023    Past Surgical History:  Procedure Laterality Date   HARDWARE REMOVAL Left 10/09/2023   Procedure: HARDWARE REMOVAL;  Surgeon: Celena Sharper, MD;  Location: [facility] OR;  Service: Orthopedics;  Laterality: Left;   ORIF ULNAR FRACTURE Left 06/13/2016   Procedure: OPEN REDUCTION INTERNAL FIXATION (ORIF) LEFT RADIUS AND ULNA FRACTURES;  Surgeon: Lonni CINDERELLA Poli, MD;  Location: [facility] OR;  Service: Orthopedics;  Laterality: Left;   ORIF ULNAR FRACTURE Left 06/09/2017   Procedure: OPEN REDUCTION INTERNAL FIXATION (ORIF) ULNAR FRACTURE INCISION AND DRAINAGE EXAM UNDER ANESTHESIA RADIAL HEAD;  Surgeon: Lucilla Lynwood BRAVO, MD;  Location: [facility] ORS;  Service: Orthopedics;  Laterality: Left;   ORIF ULNAR  FRACTURE Left 10/09/2023   Procedure: NONUNION REPAIR ULNA WITH ILIAC CREST BONE GRAFTING;  Surgeon: Celena Sharper, MD;  Location: [facility] OR;  Service: Orthopedics;  Laterality: Left;   REPAIR NON-UNION ULNA Left 11/06/2024   Procedure: REPAIR, FRACTURE NONUNION, ULNA;  Surgeon: Celena Sharper, MD;  Location: [facility] OR;  Service: Orthopedics;  Laterality: Left;  LEFT RIA  (AUTOGRAFT)    Social History   Socioeconomic History   Marital status: Single    Spouse name: Not on file   Number of children: 0   Years of education: college 1   Highest education level: Not on file  Occupational History   Occupation: Lobbyist: [employer]  Tobacco Use   Smoking status: Never   Smokeless tobacco: Never  Vaping Use   Vaping status: Never Used  Substance and Sexual Activity   Alcohol use: Yes    Alcohol/week: 1.0 standard drink of alcohol    Types: 1 Standard drinks or equivalent per week    Comment: occassional   Drug use: No   Sexual activity: Yes    Partners: Male    Birth control/protection: None  Other Topics Concern   Not on file  Social History Narrative   ** Merged History Encounter **       Social Drivers of Health   Tobacco Use: Low Risk (11/17/2024)   Patient History    Smoking Tobacco Use: Never    Smokeless Tobacco Use: Never    Passive Exposure: Not on file  Financial Resource Strain: Not on file  Food Insecurity: No Food Insecurity (10/09/2023)   Hunger Vital Sign    Worried About Running Out of Food in the Last Year: Never true    Ran Out of Food in the Last Year: Never true  Transportation Needs: No Transportation Needs (10/09/2023)   PRAPARE - Administrator, Civil Service (Medical): No    Lack of Transportation (Non-Medical): No  Physical Activity: Not on file  Stress: Not on file  Social Connections: Not on file  Depression (EYV7-0): Not on file  Alcohol Screen: Not on file  Housing: Low Risk (10/09/2023)   Housing    Last Housing Risk  Score: 0  Utilities: Not At Risk (10/09/2023)   AHC Utilities    Threatened with loss of utilities: No  Health Literacy: Not on file     FAMILY HISTORY:  We obtained a detailed, 4-generation family history.  Significant diagnoses are listed below: Family History  Problem Relation Age of Onset   Hypertension Mother    Diabetes Mother    Ovarian cancer Mother 76   Breast cancer Paternal Aunt 90   Lung cancer Maternal Grandmother 85 - 69   Cervical cancer Maternal Grandmother 59 - 69   Breast cancer Paternal Grandmother 49   Bone cancer Maternal Great-grandmother 38   Breast cancer Paternal Cousin 33   Colon cancer Neg Hx    Uterine cancer Neg Hx     Karla Nichols is unaware of previous family history of genetic testing for hereditary cancer risks There is no reported Ashkenazi Jewish ancestry.      GENETIC COUNSELING ASSESSMENT: [name] is a 39 y.o. female with a family history of cancer which is somewhat suggestive of a hereditary predisposition to cancer given the family history of her mother diagnosed with ovarian cancer and three close paternal relatives diagnosed with breast cancer. We, therefore, discussed and recommended the following at today's visit.   DISCUSSION: We discussed that 5 - 10% of cancer is hereditary, with most cases of hereditary breast and ovarian cancer associated with pathogenic variants in BRCA1/2.  There are other genes that can be associated with hereditary breast and ovarian cancer syndromes.  We discussed that testing is beneficial for several reasons, including knowing about other cancer risks, identifying potential screening and risk-reduction options that may be appropriate, and to understanding if other family members could be at risk for cancer and allowing them to undergo genetic testing.  We reviewed the characteristics, features and inheritance patterns of hereditary cancer syndromes. We also discussed genetic testing, including the appropriate  family members to test, the process of testing, insurance coverage and turn-around-time for results. We discussed the implications of a negative, positive, carrier and/or variant of uncertain significant result. We discussed that negative results would be uninformative given that Ms. Fickett does not have a personal history of cancer. We recommended Ms. Bonk pursue genetic testing for a panel that contains genes associated with breast and ovarian cancer.  Ms. Castelo was offered a common hereditary cancer panel (40+ genes) and an expanded pan-cancer panel (70+ genes). Ms. Newhart was informed of the benefits and limitations of each panel, including that expanded pan-cancer panels contain several genes that do not have clear management guidelines at this point in time.  We also discussed that as the number of genes included on a panel increases, the chances of variants of uncertain significance increases.  After considering the benefits and limitations of each gene panel,  Ms. Whitsell elected to have [facility]'s Common Hereditary Cancer +RNA. The [facility] Common Hereditary Cancers panel includes analysis of the following 48 genes: APC, ATM, AXIN2, BAP1, BARD1, BMPR1A, BRCA1, BRCA2, BRIP1, CDH1, CDK4, CDKN2A, CHEK2, CTNNA1, DICER1, EPCAM, FH, GREM1, HOXB13, KIT, MBD4, MEN1, MLH1, MSH2, MSH3, MSH6, MUTYH, NF1, NTHL1, PALB2, PDGFRA, PMS2, POLD1, POLE, PTEN, RAD51C, RAD51D, SDHA, SDHB, SDHC, SDHD, SMAD4, SMARCA4, STK11, TP53, TSC1, TSC2, VHL. RNA analysis included on applicable genes.     Based on Ms. Goodwine's family history of cancer, she meets medical criteria for genetic testing. Though Ms. Siegenthaler is not personally affected, there are no affected family members that are willing/able to undergo hereditary cancer testing. Despite that she meets criteria, she may still have an out of pocket cost. We discussed that if her out of pocket cost for testing is over $250, the laboratory should contact them to discuss self-pay  prices, patient pay assistance programs, if applicable, and other billing options.  We discussed that some people do not want to undergo genetic testing due to fear of genetic discrimination.  A federal law called the Genetic Information Non-Discrimination Act (GINA) of 2008 helps protect individuals against genetic discrimination based on their genetic test results.  It impacts both health insurance and employment.  With health insurance, it protects against increased premiums, being kicked off insurance or being forced to take a test in order to be insured.  For employment it protects against hiring, firing and promoting decisions based on genetic test results.  GINA does not apply to those in the [employer], those who work for companies with less than 15 employees, and new life insurance or long-term disability insurance policies.  Health status due to a cancer diagnosis is not protected under GINA.  PLAN: After considering the risks, benefits, and limitations, Ms. Nebergall provided informed consent to pursue genetic testing and the blood sample was sent to [facility] for analysis of the Common Hereditary Cancers panel +RNA. Results should be available within approximately 2-3 weeks' time, at which point they will be disclosed by telephone to Ms. Kruser, as will any additional recommendations warranted by these results. Ms. Territo will receive a summary of her genetic counseling visit and a copy of her results once available. This information will also be available in Epic.   Lastly, we encouraged Ms. Feldt to remain in contact with cancer genetics annually so that we can continuously update the family history and inform her of any changes in cancer genetics and testing that may be of benefit for this family.   Ms. Lai questions were answered to her satisfaction today. Our contact information was provided should additional questions or concerns arise. Thank you for the referral and allowing us   to share in the care of your patient.   Burnard Ogren, MS, [facility] Licensed, [email or web address] phone: [phone]   55 minutes were spent on the date of the encounter in service to the patient including preparation, face-to-face consultation, documentation and care coordination.  The patient was seen alone.  Drs. Gudena and/or Lanny were available to discuss this case as needed.  _______________________________________________________________________ For Office Staff:  Number of people involved in session: 1 Was an Intern/ student involved with case: no

## 2024-12-03 ENCOUNTER — Encounter: Payer: Self-pay | Admitting: Psychiatry

## 2024-12-08 ENCOUNTER — Ambulatory Visit: Payer: Self-pay | Admitting: Genetic Counselor

## 2024-12-08 ENCOUNTER — Encounter: Payer: Self-pay | Admitting: Psychiatry

## 2024-12-08 ENCOUNTER — Telehealth: Payer: Self-pay | Admitting: Genetic Counselor

## 2024-12-08 ENCOUNTER — Inpatient Hospital Stay: Payer: Self-pay | Admitting: Psychiatry

## 2024-12-08 VITALS — BP 131/83 | HR 99 | Temp 99.3°F | Resp 19 | Wt 154.4 lb

## 2024-12-08 DIAGNOSIS — D251 Intramural leiomyoma of uterus: Secondary | ICD-10-CM | POA: Diagnosis not present

## 2024-12-08 DIAGNOSIS — Z1379 Encounter for other screening for genetic and chromosomal anomalies: Secondary | ICD-10-CM

## 2024-12-08 DIAGNOSIS — E559 Vitamin D deficiency, unspecified: Secondary | ICD-10-CM | POA: Diagnosis not present

## 2024-12-08 DIAGNOSIS — R971 Elevated cancer antigen 125 [CA 125]: Secondary | ICD-10-CM | POA: Diagnosis not present

## 2024-12-08 DIAGNOSIS — D250 Submucous leiomyoma of uterus: Secondary | ICD-10-CM | POA: Diagnosis not present

## 2024-12-08 DIAGNOSIS — R978 Other abnormal tumor markers: Secondary | ICD-10-CM

## 2024-12-08 DIAGNOSIS — R1900 Intra-abdominal and pelvic swelling, mass and lump, unspecified site: Secondary | ICD-10-CM

## 2024-12-08 DIAGNOSIS — D259 Leiomyoma of uterus, unspecified: Secondary | ICD-10-CM

## 2024-12-08 NOTE — Telephone Encounter (Signed)
 LVM asking for call back to review results of genetic testing.

## 2024-12-08 NOTE — Telephone Encounter (Signed)
 I spoke to Karla Nichols to review results of genetic testing, completed with [facility] Common Hereditary Cancer +RNA panel. 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 We discussed that we do not use VUS to make management decisions or identify at risk family members.  Discussed that we do not know why there is cancer in the family. It is possible that the cancers in history occurred by chance or due to environmental factors. It is possible there are family members that have a variant that she did not inherit. It is also possible there is a hereditary cause for cancer that cannot be identified with current technology or due to a gene that was not tested. It will be important for her to keep in contact with genetics to keep up with whether additional testing may be needed.  Please see counseling note for further detail on this result.

## 2024-12-08 NOTE — Progress Notes (Signed)
 Gynecologic Oncology Return Clinic Visit  Date of Service: 12/08/2024 Referring Provider: Donnice Bury, MD   Assessment & Plan: Karla Nichols is a 39 y.o. woman with a 15 cm solid abdominopelvic mass which on MRI appears consistent with uterine fibroids who presents for follow-up treatment discussion.  Reviewed workup to date with patient.  Tumor markers were overall normal except for a CA125 elevated to 156.  Pap smear was normal cytology and HPV high-risk negative.  MRI demonstrated that the mass was consistent with an intramural fundal fibroid measuring 13.8 x 11.5 x 15 cm.  MRI demonstrated 3 additional submucosal fibroids measuring 2.4 cm, 1.7 cm, and 1.  3 cm.  MRI also noted a likely hemorrhagic cyst on the left ovary.  Reviewed that based on her MRI results this likely is consistent with a fibroid.  Did review that in some cases a mass like this could be a sarcoma instead.  However given that she has other fibroids noted on her MRI, this is most likely a fibroid.  Reviewed that a CA125 that is elevated less than 200 in a premenopausal woman may be due to benign reasons.  In particular, this may be due to her enlarged fibroid uterus.  However, we did review the possible residual risk of a malignancy being identified at time of surgery.  Reviewed treatment options for this large dominant symptomatic fibroid, including surgical treatment options to include an abdominal myomectomy or hysterectomy.  Reviewed the pros and cons of each.  Patient reports that she does not particularly desire future fertility but does not like the idea of not having the option in the future.  Because of this, she would prefer to proceed with an abdominal myomectomy.  However, she is understanding that if at time of surgery there were either concerns about the mass or about the safe amount of residual uterus, she would be accepting of a hysterectomy.  Also, we reviewed the likely hemorrhagic cyst noted on MRI.  At  this time do not feel that this needs to be removed and may resolve on its own.  Patient was consented for: Abdominal myomectomy, possible abdominal hysterectomy with bilateral salpingectomy on TBD.  We would likely plan for perioperative medications to aid in reducing blood loss like TXA.  We would consider possible frozen pathology at time of surgery and if concern for cancer, patient is amenable to proceeding with hysterectomy if indicated.  The risks of surgery were discussed in detail and she understands these to including but not limited to bleeding requiring a blood transfusion, infection, injury to adjacent organs (including but not limited to the bowels, bladder, ureters, nerves, blood vessels), thromboembolic events, wound separation, hernia, vaginal cuff separation, unforseen complication, and possible need for re-exploration.  If the patient experiences any of these events, she understands that her hospitalization or recovery may be prolonged and that she may need to take additional medications for a prolonged period. The patient will receive DVT and antibiotic prophylaxis as indicated. She voiced a clear understanding. She had the opportunity to ask questions and informed consent was obtained today. She wishes to proceed.  Patient has follow-up with Dr. Celena on Wednesday.  She reports that she may need additional surgery with him in the future.  Will have our office reach out after that visit to determine timing of the surgery.  Reviewed with patient that we have an opening as soon as 12/23/2024 if desired.  She does not require preoperative clearance. Her METs are >4.  All preoperative instructions were reviewed. Postoperative expectations were also reviewed. Written handouts were provided to the patient.  RTC for nurse preop visit once surgical date determined.  Hoy Masters, MD Gynecologic Oncology   Medical Decision Making I personally spent  TOTAL 35 minutes face-to-face and  non-face-to-face in the care of this patient, which includes all pre, intra, and post visit time on the date of service.   ----------------------- Reason for Visit: Follow-up   Interval History: Patient presents today along with her friend.  Reports she got her stitches out from her ulnar surgery.  Feels that she is healing well.  Reports that she has follow-up with Dr. Celena on Wednesday.    Past Medical/Surgical History: Past Medical History:  Diagnosis Date   Abnormality of gait    Gait abnormality    Vitamin D  insufficiency 10/12/2023    Past Surgical History:  Procedure Laterality Date   HARDWARE REMOVAL Left 10/09/2023   Procedure: HARDWARE REMOVAL;  Surgeon: Celena Sharper, MD;  Location: [facility] OR;  Service: Orthopedics;  Laterality: Left;   ORIF ULNAR FRACTURE Left 06/13/2016   Procedure: OPEN REDUCTION INTERNAL FIXATION (ORIF) LEFT RADIUS AND ULNA FRACTURES;  Surgeon: Lonni CINDERELLA Poli, MD;  Location: [facility] OR;  Service: Orthopedics;  Laterality: Left;   ORIF ULNAR FRACTURE Left 06/09/2017   Procedure: OPEN REDUCTION INTERNAL FIXATION (ORIF) ULNAR FRACTURE INCISION AND DRAINAGE EXAM UNDER ANESTHESIA RADIAL HEAD;  Surgeon: Lucilla Lynwood BRAVO, MD;  Location: [facility] ORS;  Service: Orthopedics;  Laterality: Left;   ORIF ULNAR FRACTURE Left 10/09/2023   Procedure: NONUNION REPAIR ULNA WITH ILIAC CREST BONE GRAFTING;  Surgeon: Celena Sharper, MD;  Location: [facility] OR;  Service: Orthopedics;  Laterality: Left;   REPAIR NON-UNION ULNA Left 11/06/2024   Procedure: REPAIR, FRACTURE NONUNION, ULNA;  Surgeon: Celena Sharper, MD;  Location: [facility] OR;  Service: Orthopedics;  Laterality: Left;  LEFT RIA  (AUTOGRAFT)    Family History  Problem Relation Age of Onset   Hypertension Mother    Diabetes Mother    Ovarian cancer Mother 36   Breast cancer Paternal Aunt 55   Lung cancer Maternal Grandmother 51 - 69   Cervical cancer Maternal Grandmother 68 - 69   Breast cancer Paternal Grandmother 54    Bone cancer Maternal Great-grandmother 25   Breast cancer Paternal Cousin 60   Colon cancer Neg Hx    Uterine cancer Neg Hx     Social History   Socioeconomic History   Marital status: Single    Spouse name: Not on file   Number of children: 0   Years of education: college 1   Highest education level: Not on file  Occupational History   Occupation: Lobbyist: [employer]  Tobacco Use   Smoking status: Never   Smokeless tobacco: Never  Vaping Use   Vaping status: Never Used  Substance and Sexual Activity   Alcohol use: Yes    Alcohol/week: 1.0 standard drink of alcohol    Types: 1 Standard drinks or equivalent per week    Comment: occassional   Drug use: No   Sexual activity: Yes    Partners: Male    Birth control/protection: None  Other Topics Concern   Not on file  Social History Narrative   ** Merged History Encounter **       Social Drivers of Health   Tobacco Use: Low Risk (11/17/2024)   Patient History    Smoking Tobacco Use: Never    Smokeless Tobacco  Use: Never    Passive Exposure: Not on file  Financial Resource Strain: Not on file  Food Insecurity: No Food Insecurity (12/03/2024)   Epic    Worried About Programme Researcher, Broadcasting/film/video in the Last Year: Never true    Ran Out of Food in the Last Year: Never true  Transportation Needs: No Transportation Needs (10/09/2023)   PRAPARE - Administrator, Civil Service (Medical): No    Lack of Transportation (Non-Medical): No  Physical Activity: Not on file  Stress: Not on file  Social Connections: Not on file  Depression (PHQ2-9): Low Risk (12/03/2024)   Depression (PHQ2-9)    PHQ-2 Score: 0  Alcohol Screen: Not on file  Housing: Unknown (12/03/2024)   Epic    Unable to Pay for Housing in the Last Year: No    Number of Times Moved in the Last Year: Not on file    Homeless in the Last Year: No  Utilities: Not At Risk (12/03/2024)   Epic    Threatened with loss of utilities: No  Health  Literacy: Not on file    Current Medications: Current Medications[1]  Review of Symptoms: Complete 10-system review is positive for: none  Physical Exam: BP 131/83 (BP Location: Left Arm, Patient Position: Sitting)   Pulse 99   Temp 99.3 F (37.4 C) (Oral)   Resp 19   Wt 154 lb 6.4 oz (70 kg)   LMP 11/11/2024 (Exact Date)   SpO2 100%   BMI 26.50 kg/m  General: Alert, oriented, no acute distress. HEENT: Normocephalic, atraumatic. Neck symmetric without masses. Sclera anicteric. SABRA Chest: Normal work of breathing.   Laboratory & Radiologic Studies: Lab Results  Component Value Date   CAN125 156.0 (H) 11/17/2024   CEA <1.00 11/17/2024   INHBB 113.6 11/17/2024   AFPTUMOR <1.8 11/17/2024   HCGQUANT <1 11/17/2024   LDH 171 11/17/2024   Diagnosis  Date Value Ref Range Status  11/17/2024   Final   - Negative for Intraepithelial Lesions or Malignancy (NILM)  11/17/2024 - Benign reactive/reparative changes  Final   High risk HPV  Date Value Ref Range Status  11/17/2024 Negative  Final     MR Pelvis W Wo Contrast 11/21/2024  Narrative CLINICAL DATA:  Large palpable lower abdominal and pelvic soft tissue mass.  EXAM: MRI PELVIS WITHOUT AND WITH CONTRAST  TECHNIQUE: Multiplanar multisequence MR imaging of the pelvis was performed both before and after administration of intravenous contrast.  CONTRAST:  6mL GADAVIST  GADOBUTROL  1 MMOL/ML IV SOLN  COMPARISON:  CT on 11/06/2024  FINDINGS: Lower Urinary Tract: No urinary bladder or urethral abnormality identified.  Bowel: Unremarkable pelvic bowel loops.  Vascular/Lymphatic: Unremarkable. No pathologically enlarged pelvic lymph nodes identified.  Reproductive:  -- Uterus: Measures 25.8 by 13.7 x 17.2 cm. Markedly enlarged uterus extends into the right abdomen. A dominant intramural fibroid is seen in the uterine fundus, which measures 13.8 x 11.5 by 15.0 cm. Three small submucosal fibroids are seen in the mid  and lower uterine corpus which measure 2.4 cm, 1.7 cm, and 1.3 cm in diameter. Cervix and vagina are unremarkable in appearance.  -- Right ovary: Appears normal. No ovarian or adnexal masses identified.  -- Left ovary: A mildly complex hemorrhagic cyst with a multiple thin septations is seen in the left ovary which shows T1 and T2 hyperintensity. This measures 3.5 x 2.9 cm.  Other: Tiny amount of free pelvic fluid, within the physiologic range. Moderate bilateral pelvic varices, right side  greater than left. Mild right hydronephrosis, attributable to extrinsic ureteral compression by the enlarged uterus.  Musculoskeletal:  Unremarkable.  IMPRESSION: Markedly enlarged uterus, with dominant 15 cm intramural fibroid in the fundus. Three small submucosal fibroids noted in the mid and lower uterine corpus.  3.5 cm mildly complex hemorrhagic cyst in the left ovary. Recommend continued follow-up by ultrasound in 3 months.  Moderate pelvic varices, right side greater than left.  Mild right hydronephrosis, attributable to extrinsic ureteral compression by the enlarged uterus.   Electronically Signed By: Norleen DELENA Kil M.D. On: 11/23/2024 17:02     [1]  Current Outpatient Medications:    acetaminophen  (TYLENOL ) 500 MG tablet, Take 1-2 tablets (500-1,000 mg total) by mouth every 8 (eight) hours as needed for mild pain (pain score 1-3) or moderate pain (pain score 4-6) (pain score 1-3 or temp > 100.5)., Disp: 60 tablet, Rfl: 0   Calcium Carb-Cholecalciferol  (CALTRATE 600+D3 PO), Take 1 tablet by mouth daily., Disp: , Rfl:    cetirizine (ZYRTEC) 10 MG tablet, Take 10 mg by mouth daily., Disp: , Rfl:

## 2024-12-08 NOTE — Progress Notes (Signed)
 HPI:  Ms. Deol was previously seen in the [facility] due to a family history of cancer and concerns regarding a hereditary predisposition to cancer. Please refer to our prior cancer genetics clinic note for more information regarding our discussion, assessment and recommendations, at the time. Ms. Bias recent genetic test results were disclosed to her, as were recommendations warranted by these results. These results and recommendations are discussed in more detail below.  Results were disclosed via telephone on 12/08/24.   FAMILY HISTORY:  We obtained a detailed, 4-generation family history.  Significant diagnoses are listed below: Family History  Problem Relation Age of Onset   Hypertension Mother    Diabetes Mother    Ovarian cancer Mother 21   Breast cancer Paternal Aunt 29   Lung cancer Maternal Grandmother 40 - 69   Cervical cancer Maternal Grandmother 40 - 69   Breast cancer Paternal Grandmother 76   Bone cancer Maternal Great-grandmother 76   Breast cancer Paternal Cousin 40   Colon cancer Neg Hx    Uterine cancer Neg Hx     Ms. Dalia is unaware of previous family history of genetic testing for hereditary cancer risks There is no reported Ashkenazi Jewish ancestry.      GENETIC TEST RESULTS: Genetic testing reported out on 12/04/24 through the Common Hereditary Cancer +RNA panel found no pathogenic mutations. The [facility] Common Hereditary Cancers panel includes analysis of the following 48 genes: APC, ATM, AXIN2, BAP1, BARD1, BMPR1A, BRCA1, BRCA2, BRIP1, CDH1, CDK4, CDKN2A, CHEK2, CTNNA1, DICER1, EPCAM, FH, GREM1, HOXB13, KIT, MBD4, MEN1, MLH1, MSH2, MSH3, MSH6, MUTYH, NF1, NTHL1, PALB2, PDGFRA, PMS2, POLD1, POLE, PTEN, RAD51C, RAD51D, SDHA, SDHB, SDHC, SDHD, SMAD4, SMARCA4, STK11, TP53, TSC1, TSC2, VHL. RNA analysis included on applicable genes. The test report has been scanned into EPIC and is located under the Molecular Pathology section of the Results  Review tab.  A portion of the result report is included below for reference.     We discussed with Ms. Sarwar that because current genetic testing is not perfect, it is possible there may be a gene mutation in one of these genes that current testing cannot detect, but that chance is small.  We also discussed, that there could be another gene that has not yet been discovered, or that we have not yet tested, that is responsible for the cancer diagnoses in the family. It is also possible there is a hereditary cause for the cancer in the family that Ms. Boran did not inherit and therefore was not identified in her testing.  Therefore, it is important to remain in touch with cancer genetics in the future so that we can continue to offer Ms. Kuck the most up to date genetic testing.   Genetic testing did identify a variant of uncertain significance (VUS) was identified in the BARD1 gene called c.1339C>G (p.Leu447Val).  At this time, it is unknown if this variant is associated with increased cancer risk or if this is a normal finding, but most variants such as this get reclassified to being inconsequential. It should not be used to make medical management decisions. With time, we suspect the lab will determine the significance of this variant, if any. If we do learn more about it, we will try to contact Ms. Wentz to discuss it further. However, it is important to stay in touch with us  periodically and keep the address and phone number up to date.  ADDITIONAL GENETIC TESTING: We discussed with Ms. Regenia  that there are other genes that are associated with increased cancer risk that can be analyzed. Should Ms. Raisanen wish to pursue additional genetic testing, we are happy to discuss and coordinate this testing, at any time.    Additionally, genetic testing did not cover genes associated with other types of inherited conditions.   CANCER SCREENING RECOMMENDATIONS: Ms. Depass test result is considered  negative (normal).  This means that we have not identified a hereditary cause for her family history of cancer at this time. Most cancers happen by chance and this negative test suggests that her family history of cancer may fall into this category.    Possible reasons for Ms. Espiritu's negative genetic test include:  1. There may be a gene mutation in one of these genes that current testing methods cannot detect but that chance is small.  2. There could be another gene that has not yet been discovered, or that we have not yet tested, that is responsible for the cancer diagnoses in the family.  3.  There may be no hereditary risk for cancer in the family. The cancers in Ms. Sykora and/or her family may be sporadic/familial or due to other genetic and environmental factors. 4. It is also possible there is a hereditary cause for the cancer in the family that Ms. Dumire did not inherit.  Therefore, it is recommended she continue to follow the cancer management and screening guidelines provided by her gyn oncology and primary healthcare providers. An individual's cancer risk and medical management are not determined by genetic test results alone. Overall cancer risk assessment incorporates additional factors, including personal medical history, family history, and any available genetic information that may result in a personalized plan for cancer prevention and surveillance  Given Ms. Viglione's family history, we must interpret these negative results with some caution.  Families with features suggestive of hereditary risk for cancer tend to have multiple family members with cancer, diagnoses in multiple generations and diagnoses before the age of 75. Ms. Sardina's family exhibits some of these features. Thus, this result may simply reflect our current inability to detect all mutations within these genes or there may be a different gene that has not yet been discovered or tested.   Ms. Linares has been determined  to be at high risk for breast cancer. her Tyrer-Cuzick risk score is 21.7%.  For women with a greater than 20% lifetime risk of breast cancer, the [employer] (NCCN) recommends the following:   Clinical encounter every 6-12 months to begin when identified as being at increased risk, but not before age 41  Annual mammograms. Tomosynthesis is recommended starting 10 years earlier than the youngest breast cancer diagnosis in the family or at age 39 (whichever comes first), but not before age 52   Annual breast MRI starting 10 years earlier than the youngest breast cancer diagnosis in the family or at age 63 (whichever comes first), but not before age 20.   We, therefore, discussed that it is reasonable for Ms. Wickes to be followed by a high-risk breast cancer clinic; in addition to a yearly mammogram and physical exam by a healthcare provider, she should discuss the usefulness of an annual breast MRI with the high-risk clinic providers. Ms. Christoffel declined a referral at this time, but plans to discuss with her healthcare providers.   RECOMMENDATIONS FOR FAMILY MEMBERS:  Individuals in this family might be at some increased risk of developing cancer, over the general population risk, simply due  to the family history of cancer.  We recommended women in this family have a yearly mammogram beginning at age 29, or 24 years younger than the earliest onset of cancer, an annual clinical breast exam, and perform monthly breast self-exams. Women in this family should also have a gynecological exam as recommended by their primary provider. All family members should be referred for colonoscopy starting at age 2, or 54 years younger than the earliest onset of cancer.   It is also possible there is a hereditary cause for the cancer in Ms. Wolden's family that she did not inherit and therefore was not identified in her.  Based on Ms. Richman's family history, we recommended her cousin, who was  diagnosed with breast cancer, have genetic counseling and testing. Ms. Steedman will let us  know if we can be of any assistance in coordinating genetic counseling and/or testing for this family member.   FOLLOW-UP: Lastly, we discussed with Ms. Selner that cancer genetics is a rapidly advancing field and it is possible that new genetic tests will be appropriate for her and/or her family members in the future. We encouraged her to remain in contact with cancer genetics on an annual basis so we can update her personal and family histories and let her know of advances in cancer genetics that may benefit this family.   Our contact number was provided. Ms. Vajda questions were answered to her satisfaction, and she knows she is welcome to call us  at anytime with additional questions or concerns.   Burnard Ogren, MS, [facility] Licensed, [email or web address] [phone]

## 2024-12-08 NOTE — Patient Instructions (Addendum)
 It was a pleasure to see you in clinic today. - We discussed consideration of an abdominal myomectomy which were remove the fibroid through an open incision.  It is possible you would need a hysterectomy still but we can try to remove the fibroid alone. -Let us  know after visit with Dr. Celena what he is thinking for your future surgery so we can plan ahead for timing of this surgery.  We have some operating room time tentatively on February 3 we can do it but we can also do it at a later time.   Thank you very much for allowing me to provide care for you today.  I appreciate your confidence in choosing our Gynecologic Oncology team at [facility].  If you have any questions about your visit today please call our office or send us  a MyChart message and we will get back to you as soon as possible.

## 2024-12-08 NOTE — Op Note (Signed)
 Karla Nichols, CLECKLEY MEDICAL RECORD NO: [id number] ACCOUNT NO: [id number] DATE OF BIRTH: Mar 27, 1986 FACILITY: [facility] LOCATION: [facility] PHYSICIAN: Ozell DEL. Celena, MD  Operative Report   DATE OF PROCEDURE: 11/06/2024   PREOPERATIVE DIAGNOSES: 1.  Left proximal ulna nonunion. 2.  Broken hardware left ulna.   POSTOPERATIVE DIAGNOSES: 1.  Left proximal ulna nonunion. 2.  Broken hardware left ulna. 3.  Abdominal mass.   PROCEDURES: 1.  Repair of left ulna nonunion. 2.  Placement of antibiotic spacer left ulna.   SURGEON:  Ozell Celena, MD.   ASSISTANT:  Francis Mt, PA-C.   ANESTHESIA:  General, supplemented with nerve block.   SPECIMENS:  Multiple anaerobic, aerobic from left ulna nonunion.   PATIENT DISPOSITION:  To PACU.   CONDITION:  Stable.   BRIEF SUMMARY OF INDICATIONS FOR PROCEDURE:  The patient is a very pleasant 39 year old right-hand dominant female with a long history of left forearm nonunions and a previous repair of nonunion by myself just over a year ago.  The patient seemed to have  healed and was doing well but then developed pain and instability with subsequent x-rays demonstrating recurrent nonunion and fracture of her hardware.  I discussed with the patient the risks and benefits of surgical repair including the possibility of  persistent nonunion, the need to stage surgery and do a bone grafting if the defect was large enough, as well as infection, nerve injury, vessel injury, loss of motion, and others.  She acknowledged these risks and did wish to proceed.  BRIEF SUMMARY OF PROCEDURE:  The patient was given perioperative antibiotics after cultures were taken but held before. Patient was taken to the operating room where general anesthesia was induced.  She was positioned supine on the table by myself and at that time I recognized an extremely large abdominal  mass.  The mass was firm with round smooth contours with no apparent wound or prominence.  I was  concerned about the possibility of midterm pregnancy and although I had checked the pregnancy tests preoperatively as a matter of course, these were double checked again and confirmed as negative.  I then  contacted my colleague Dr. Dann Hummer from general surgery for an intraoperative consultation to make sure that it would be acceptable to proceed with surgery as expected.  He did confirm that this mass was at least 20 cm, but did not think there would be any contraindication to proceeding with the elbow and agreed with my plan to obtain a CT scan immediately after surgery.  Consequently, standard prep and drape was performed using chlorhexidine  wash and Betadine  scrub and paint.  After draping, a timeout was held.  I began by remaking the incision directly over the olecranon and carried dissection down to the Acumed plate,  which was removed along with all the screws.  Fortunately there were no retained screw fragments within.  Unfortunately, there was a large area of cavitary nonunion.  I did scrape this out in its entirety and made sure to get back to healthy bleeding bone on  either side. Anearobic and aerobic cultures were sent from this area. It measured approximately 2 x 2 cm x 1.2 cm.  In order to stabilize the gap and make sure that adequate fixation and alignment was maintained, it did require placement of a mini-frag plate, which was secured along the medial side.  Two  screws were secured both proximally and distally to this while keeping the lamina spreader maximally distracting within the nonunion site  so as to restore appropriate tension to the soft tissues.  This problem was exacerbated by the chronic radial neck cavitary  nonunion present for years.  Once either side of the gap was secured, I then placed the long plate from [employer], securing the Tines on the olecranon proximally and then a coronoid screw and additional locked screws in the cortex and then  distally three  screws widely placed in the long plate.  In order to use the Masquelet technique, a cement spacer was then placed into this large gap using cement impregnated with vancomycin , tobramycin , and methylene blue  for easy recognition.  I did try to overlap the bone ends to allow for a good bone  contact at the subsequent stage grafting.  The soft tissues were carefully reapproximated in layered fashion with PDS and nylon.  There was no evidence of infection, purulence, or otherwise.  Some cultures were sent at the nonunion site, however.  Francis Mt, PA-C, was present and assisted me throughout.  We did place her into a splint in neutral rotation and then she was taken to the PACU in stable condition.  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  We anticipate staged autogenous bone grafting with reamed intramedullary aspirate as the previous iliac crest  autografting yielded very little autogenous bone.  In the meantime, she will of course need workup and treatment for this large abdominal mass, which is currently thought to be gynecologic in nature.  I have also discussed this with the general surgeon  who has had lots of prior contact with the family, Dr. Donnice Bury, and he has been very gracious in assisting with identifying the appropriate gynecologic referral afterward.  We will get a CT scan today before she is discharged and confirm that plan  with Dr. Bury.   PUS D: 12/08/2024 11:46:00 am T: 12/08/2024 12:32:00 pm  JOB: [id number]/ [id number]

## 2024-12-10 ENCOUNTER — Telehealth: Payer: Self-pay

## 2024-12-10 NOTE — Telephone Encounter (Signed)
 Karla Nichols called regarding her potential surgery date of 2/3 with Dr.Newton. She states she is having arm surgery on 1/27 with Dr.Handy, with potential 1 week recovery time. She would like to shoot for mid to end of February surgery date with Dr.Newton.   Pt aware I will send message to provider and give her a call back.

## 2024-12-11 ENCOUNTER — Encounter: Payer: Self-pay | Admitting: Psychiatry

## 2024-12-11 ENCOUNTER — Telehealth: Payer: Self-pay | Admitting: *Deleted

## 2024-12-11 NOTE — Telephone Encounter (Signed)
 Spoke with patient in regards to two OR date options for her surgery with Dr. Eldonna. February 24 or March 3rd? Patient states she needs to think about it and will call the office back. Requested patient to call the office back tomorrow.

## 2024-12-12 NOTE — Telephone Encounter (Signed)
 Patient called and stated she has decided she would like to proceed with surgery on March 3rd. Thanked pt for this info and advised her that our office will set that up and call her with further information regarding pre-op appointments. Pt verbalized understanding and had no other concerns at this time.

## 2024-12-15 ENCOUNTER — Inpatient Hospital Stay: Admitting: Psychiatry

## 2024-12-17 ENCOUNTER — Telehealth: Payer: Self-pay | Admitting: *Deleted

## 2024-12-17 NOTE — Telephone Encounter (Signed)
 Spoke with Ms. Chrobak who was made aware of  her pre-op appointment with gyn nurse navigator on Tuesday, February 24 th. At 1 pm. Pt agreed to date and time and thanked the office for calling.

## 2024-12-23 ENCOUNTER — Encounter (HOSPITAL_COMMUNITY): Payer: Self-pay | Admitting: Orthopedic Surgery

## 2024-12-23 ENCOUNTER — Other Ambulatory Visit: Payer: Self-pay

## 2024-12-23 NOTE — Progress Notes (Signed)
 SDW call  Patient was given pre-op instructions over the phone. Patient verbalized understanding of instructions provided.  Denied any SOB, fever or cough   PCP - Dr. [employer] Cardiologist -  Pulmonary:    PPM/ICD - denies Device Orders - na Rep Notified - na   Chest x-ray -  EKG -   Stress Test - ECHO -  Cardiac Cath -   Sleep Study/sleep apnea/CPAP: denies  Non-diabetic   Blood Thinner Instructions: denies Aspirin  Instructions:denies   ERAS Protcol - Clears until 0500  Anesthesia review: No  Your procedure is scheduled on Thursday December 25, 2024   Report to [facility] Main Entrance A at  0530  A.M., then check in with the Admitting office.  Call this number if you have problems the morning of surgery:  [phone]   If you have any questions prior to your surgery date call [phone]: Open Monday-Friday 8am-4pm If you experience any cold or flu symptoms such as cough, fever, chills, shortness of breath, etc. between now and your scheduled surgery, please notify us  at the above number    Remember:  Do not eat after midnight the night before your surgery  You may drink clear liquids until  0500   the morning of your surgery.   Clear liquids allowed are: Water, Non-Citrus Juices (without pulp), Carbonated Beverages, Clear Tea, Black Coffee ONLY (NO MILK, CREAM OR POWDERED CREAMER of any kind), and Gatorade   Take these medicines the morning of surgery with A SIP OF WATER:  Zyrtec  As needed: Tylenol   As of today, STOP taking any Aspirin  (unless otherwise instructed by your surgeon) Aleve , Naproxen , Ibuprofen , Motrin , Advil , Goody's, BC's, all herbal medications, fish oil, and all vitamins.

## 2024-12-24 NOTE — H&P (Signed)
 "             Orthopaedic Trauma Service (OTS) Consult   Patient ID: Karla Nichols MRN: [id number] DOB/AGE: May 02, 1986 39 y.o.     HPI: Karla Nichols is an 39 y.o. female presents today for repair of L ulna chronic nonunion. Has undergone numerous surgeries on her L ulna and has persistent nonunion. Last surgery was 11/06/2024. Due to the size of the defect we elected to place abx spacer.  Pt is scheduled to have a myomectomy for uterine mass next month   Past Medical History:  Diagnosis Date   Abnormality of gait    Gait abnormality    Vitamin D  insufficiency 10/12/2023    Past Surgical History:  Procedure Laterality Date   HARDWARE REMOVAL Left 10/09/2023   Procedure: HARDWARE REMOVAL;  Surgeon: Celena Sharper, MD;  Location: [facility] OR;  Service: Orthopedics;  Laterality: Left;   ORIF ULNAR FRACTURE Left 06/13/2016   Procedure: OPEN REDUCTION INTERNAL FIXATION (ORIF) LEFT RADIUS AND ULNA FRACTURES;  Surgeon: Lonni CINDERELLA Poli, MD;  Location: [facility] OR;  Service: Orthopedics;  Laterality: Left;   ORIF ULNAR FRACTURE Left 06/09/2017   Procedure: OPEN REDUCTION INTERNAL FIXATION (ORIF) ULNAR FRACTURE INCISION AND DRAINAGE EXAM UNDER ANESTHESIA RADIAL HEAD;  Surgeon: Lucilla Lynwood FORBES, MD;  Location: [facility] ORS;  Service: Orthopedics;  Laterality: Left;   ORIF ULNAR FRACTURE Left 10/09/2023   Procedure: NONUNION REPAIR ULNA WITH ILIAC CREST BONE GRAFTING;  Surgeon: Celena Sharper, MD;  Location: [facility] OR;  Service: Orthopedics;  Laterality: Left;   REPAIR NON-UNION ULNA Left 11/06/2024   Procedure: REPAIR, FRACTURE NONUNION, ULNA;  Surgeon: Celena Sharper, MD;  Location: [facility] OR;  Service: Orthopedics;  Laterality: Left;  LEFT RIA  (AUTOGRAFT)    Family History  Problem Relation Age of Onset   Hypertension Mother    Diabetes Mother    Ovarian cancer Mother 33   Breast cancer Paternal Aunt 30   Lung cancer Maternal Grandmother 14 - 69   Cervical cancer Maternal Grandmother 56 - 69   Breast  cancer Paternal Grandmother 12   Bone cancer Maternal Great-grandmother 64   Breast cancer Paternal Cousin 5   Colon cancer Neg Hx    Uterine cancer Neg Hx     Social History:  reports that she has never smoked. She has never used smokeless tobacco. She reports that she does not currently use alcohol. She reports that she does not use drugs.   Allergies: Allergies[1]  Medications: I have reviewed the patient's current medications. Current Outpatient Medications  Medication Instructions   Acetaminophen  Extra Strength 500-1,000 mg, Oral, Every 8 hours PRN   Calcium Carb-Cholecalciferol  (CALTRATE 600+D3 PO) 1 tablet, Daily   cetirizine (ZYRTEC) 10 mg, Daily     No results found for this or any previous visit (from the past 48 hours).  No results found.  Intake/Output    None      ROS L forearm pain   Height 5' 4 (1.626 m), weight 67.6 kg, last menstrual period 12/13/2024. Physical Exam  Gen: pleasant as always, NAD L UEx  Surgical wounds well healed  Motor and sensory functions intact to L UEx  No significant swelling   + radial pulse   Good elbow and wrist motion   Assessment/Plan:  39 y/o female with chronic nonunion L ulna   OR for grafting of L ulna Plan for RIA  Will check preop xray of L femur  Anticipate outpt surgery vs overnight stay  Pt agrees with plan and  wishes to proceed     Francis MICAEL Mt, PA-C [phone] (C) 12/24/2024, 10:52 AM  [facility] [address] [place] [address] [phone] MAXIMINO MILLING (F)    After 5pm and on the weekends please log on to Amion, go to orthopaedics and the look under the [facility] Call for the provider(s) on call. You can also call our office at [phone] and then follow the prompts to be connected to the call team.      [1] No Known Allergies  "

## 2024-12-24 NOTE — Anesthesia Preprocedure Evaluation (Signed)
"                                    Anesthesia Evaluation  Patient identified by MRN, date of birth, ID band Patient awake    Reviewed: Allergy & Precautions, NPO status , Patient's Chart, lab work & pertinent test results  Airway Mallampati: II  TM Distance: >3 FB Neck ROM: Full    Dental no notable dental hx. (+) Teeth Intact, Dental Advisory Given   Pulmonary neg pulmonary ROS   Pulmonary exam normal breath sounds clear to auscultation       Cardiovascular negative cardio ROS Normal cardiovascular exam Rhythm:Regular Rate:Normal     Neuro/Psych negative neurological ROS  negative psych ROS   GI/Hepatic negative GI ROS, Neg liver ROS,,,  Endo/Other  negative endocrine ROS    Renal/GU negative Renal ROS  negative genitourinary   Musculoskeletal negative musculoskeletal ROS (+)    Abdominal   Peds  Hematology negative hematology ROS (+)   Anesthesia Other Findings Cerebellar Ataxia c/b Falls  Reproductive/Obstetrics                              Anesthesia Physical Anesthesia Plan  ASA: 2  Anesthesia Plan: General   Post-op Pain Management: Tylenol  PO (pre-op)*   Induction: Intravenous  PONV Risk Score and Plan: 3 and Midazolam , Dexamethasone  and Ondansetron   Airway Management Planned: Oral ETT  Additional Equipment:   Intra-op Plan:   Post-operative Plan: Extubation in OR  Informed Consent: I have reviewed the patients History and Physical, chart, labs and discussed the procedure including the risks, benefits and alternatives for the proposed anesthesia with the patient or authorized representative who has indicated his/her understanding and acceptance.     Dental advisory given  Plan Discussed with: CRNA  Anesthesia Plan Comments:          Anesthesia Quick Evaluation  "

## 2024-12-25 ENCOUNTER — Ambulatory Visit (HOSPITAL_COMMUNITY)
Admission: RE | Admit: 2024-12-25 | Discharge: 2024-12-25 | Disposition: A | Source: Home / Self Care | Attending: Orthopedic Surgery | Admitting: Orthopedic Surgery

## 2024-12-25 ENCOUNTER — Encounter (HOSPITAL_COMMUNITY): Admission: RE | Disposition: A | Payer: Self-pay | Source: Home / Self Care | Attending: Orthopedic Surgery

## 2024-12-25 ENCOUNTER — Encounter (HOSPITAL_COMMUNITY): Payer: Self-pay | Admitting: Orthopedic Surgery

## 2024-12-25 ENCOUNTER — Other Ambulatory Visit (HOSPITAL_COMMUNITY): Payer: Self-pay

## 2024-12-25 ENCOUNTER — Ambulatory Visit (HOSPITAL_COMMUNITY)

## 2024-12-25 ENCOUNTER — Encounter (HOSPITAL_COMMUNITY): Payer: Self-pay | Admitting: Anesthesiology

## 2024-12-25 DIAGNOSIS — S52202G Unspecified fracture of shaft of left ulna, subsequent encounter for closed fracture with delayed healing: Secondary | ICD-10-CM

## 2024-12-25 LAB — CBC
HCT: 34.6 % — ABNORMAL LOW (ref 36.0–46.0)
Hemoglobin: 10.9 g/dL — ABNORMAL LOW (ref 12.0–15.0)
MCH: 24.4 pg — ABNORMAL LOW (ref 26.0–34.0)
MCHC: 31.5 g/dL (ref 30.0–36.0)
MCV: 77.6 fL — ABNORMAL LOW (ref 80.0–100.0)
Platelets: 332 10*3/uL (ref 150–400)
RBC: 4.46 MIL/uL (ref 3.87–5.11)
RDW: 14.6 % (ref 11.5–15.5)
WBC: 7.4 10*3/uL (ref 4.0–10.5)
nRBC: 0 % (ref 0.0–0.2)

## 2024-12-25 LAB — POCT PREGNANCY, URINE: Preg Test, Ur: NEGATIVE

## 2024-12-25 MED ORDER — OXYCODONE HCL 5 MG/5ML PO SOLN
5.0000 mg | Freq: Once | ORAL | Status: AC | PRN
  Administered 2024-12-25: 5 mg via ORAL

## 2024-12-25 MED ORDER — PROPOFOL 10 MG/ML IV BOLUS
INTRAVENOUS | Status: AC
  Filled 2024-12-25: qty 20

## 2024-12-25 MED ORDER — ROCURONIUM BROMIDE 10 MG/ML (PF) SYRINGE
PREFILLED_SYRINGE | INTRAVENOUS | Status: DC | PRN
  Administered 2024-12-25: 20 mg via INTRAVENOUS
  Administered 2024-12-25: 70 mg via INTRAVENOUS

## 2024-12-25 MED ORDER — MIDAZOLAM HCL (PF) 2 MG/2ML IJ SOLN
INTRAMUSCULAR | Status: DC | PRN
  Administered 2024-12-25: 2 mg via INTRAVENOUS

## 2024-12-25 MED ORDER — ONDANSETRON HCL 4 MG/2ML IJ SOLN
INTRAMUSCULAR | Status: DC | PRN
  Administered 2024-12-25: 4 mg via INTRAVENOUS

## 2024-12-25 MED ORDER — LACTATED RINGERS IV SOLN: INTRAVENOUS | Status: DC | PRN

## 2024-12-25 MED ORDER — AMISULPRIDE (ANTIEMETIC) 5 MG/2ML IV SOLN: 10.0000 mg | Freq: Once | INTRAVENOUS | Status: DC | PRN

## 2024-12-25 MED ORDER — MIDAZOLAM HCL 2 MG/2ML IJ SOLN
INTRAMUSCULAR | Status: AC
  Filled 2024-12-25: qty 2

## 2024-12-25 MED ORDER — LIDOCAINE 2% (20 MG/ML) 5 ML SYRINGE
INTRAMUSCULAR | Status: DC | PRN
  Administered 2024-12-25: 60 mg via INTRAVENOUS

## 2024-12-25 MED ORDER — LACTATED RINGERS IV SOLN: INTRAVENOUS | Status: DC

## 2024-12-25 MED ORDER — OXYCODONE HCL 5 MG/5ML PO SOLN
ORAL | Status: AC
  Filled 2024-12-25: qty 5

## 2024-12-25 MED ORDER — METHOCARBAMOL 500 MG PO TABS
500.0000 mg | ORAL_TABLET | Freq: Four times a day (QID) | ORAL | 0 refills | Status: AC | PRN
  Filled 2024-12-25: qty 30, 4d supply, fill #0

## 2024-12-25 MED ORDER — FENTANYL CITRATE (PF) 100 MCG/2ML IJ SOLN
INTRAMUSCULAR | Status: AC
  Filled 2024-12-25: qty 2

## 2024-12-25 MED ORDER — CEFAZOLIN SODIUM-DEXTROSE 2-4 GM/100ML-% IV SOLN
2.0000 g | INTRAVENOUS | Status: AC
  Administered 2024-12-25: 2 g via INTRAVENOUS
  Filled 2024-12-25: qty 100

## 2024-12-25 MED ORDER — VANCOMYCIN HCL 1000 MG IV SOLR
INTRAVENOUS | Status: AC
  Filled 2024-12-25: qty 60

## 2024-12-25 MED ORDER — ACETAMINOPHEN 500 MG PO TABS
1000.0000 mg | ORAL_TABLET | Freq: Once | ORAL | Status: AC
  Administered 2024-12-25: 1000 mg via ORAL
  Filled 2024-12-25: qty 2

## 2024-12-25 MED ORDER — FENTANYL CITRATE (PF) 250 MCG/5ML IJ SOLN
INTRAMUSCULAR | Status: DC | PRN
  Administered 2024-12-25: 50 ug via INTRAVENOUS
  Administered 2024-12-25: 100 ug via INTRAVENOUS
  Administered 2024-12-25 (×2): 50 ug via INTRAVENOUS

## 2024-12-25 MED ORDER — PROPOFOL 500 MG/50ML IV EMUL
INTRAVENOUS | Status: DC | PRN
  Administered 2024-12-25: 30 ug/kg/min via INTRAVENOUS

## 2024-12-25 MED ORDER — OXYCODONE HCL 5 MG PO TABS
5.0000 mg | ORAL_TABLET | Freq: Three times a day (TID) | ORAL | 0 refills | Status: AC | PRN
  Filled 2024-12-25: qty 30, 5d supply, fill #0

## 2024-12-25 MED ORDER — FENTANYL CITRATE (PF) 100 MCG/2ML IJ SOLN
25.0000 ug | INTRAMUSCULAR | Status: DC | PRN
  Administered 2024-12-25: 25 ug via INTRAVENOUS

## 2024-12-25 MED ORDER — PROPOFOL 10 MG/ML IV BOLUS
INTRAVENOUS | Status: DC | PRN
  Administered 2024-12-25: 150 mg via INTRAVENOUS

## 2024-12-25 MED ORDER — CHLORHEXIDINE GLUCONATE 0.12 % MT SOLN
15.0000 mL | Freq: Once | OROMUCOSAL | Status: AC
  Administered 2024-12-25: 15 mL via OROMUCOSAL
  Filled 2024-12-25: qty 15

## 2024-12-25 MED ORDER — SUGAMMADEX SODIUM 200 MG/2ML IV SOLN
INTRAVENOUS | Status: DC | PRN
  Administered 2024-12-25: 200 mg via INTRAVENOUS

## 2024-12-25 MED ORDER — ORAL CARE MOUTH RINSE: 15.0000 mL | Freq: Once | OROMUCOSAL | Status: AC

## 2024-12-25 MED ORDER — OXYCODONE HCL 5 MG PO TABS: 5.0000 mg | ORAL_TABLET | Freq: Once | ORAL | Status: AC | PRN

## 2024-12-25 MED ORDER — FENTANYL CITRATE (PF) 250 MCG/5ML IJ SOLN
INTRAMUSCULAR | Status: AC
  Filled 2024-12-25: qty 5

## 2024-12-25 MED ORDER — ONDANSETRON 4 MG PO TBDP
4.0000 mg | ORAL_TABLET | Freq: Three times a day (TID) | ORAL | 0 refills | Status: AC | PRN
  Filled 2024-12-25: qty 20, 7d supply, fill #0

## 2024-12-25 MED ORDER — DEXAMETHASONE SOD PHOSPHATE PF 10 MG/ML IJ SOLN
INTRAMUSCULAR | Status: DC | PRN
  Administered 2024-12-25: 8 mg via INTRAVENOUS

## 2024-12-25 NOTE — Discharge Instructions (Addendum)
 "  [facility] Discharge Instructions   General Discharge Instructions   WEIGHT BEARING STATUS: Weight-bear as tolerated left leg.  Okay to move left arm and use left arm for basic daily activities  RANGE OF MOTION/ACTIVITY: Unrestricted range of motion of left hip and knee.  No  motion restrictions with left elbow.  Slowly increase activity  Bone health:   Review the following resource for additional information regarding bone health  [email or web address]  Wound Care: Okay to start dressing changes on 12/28/2024.  Please see below   Discharge Wound Care Instructions  Do NOT apply any ointments, solutions or lotions to pin sites or surgical wounds.  These prevent needed drainage and even though solutions like hydrogen peroxide kill bacteria, they also damage cells lining the pin sites that help fight infection.  Applying lotions or ointments can keep the wounds moist and can cause them to breakdown and open up as well. This can increase the risk for infection. When in doubt call the office.  Surgical incisions should be dressed daily.  If any drainage is noted, use one layer of adaptic or Mepitel, then gauze and tape.  Alternatively you can use a silicone foam dressing such as a Mepilex  [email or web address]  [email or web address]  These dressing supplies should be available at local medical supply stores ([facility], [facility], etc). They are not usually carried at places like [facility], [facility], [facility], etc  Once the incision is completely dry and without drainage, it may be left open to air out.  Showering may begin 36-48 hours later.  Cleaning gently with soap and water.  Diet: as you were eating  previously.  Can use over the counter stool softeners and bowel preparations, such as Miralax , to help with bowel movements.  Narcotics can be constipating.  Be sure to drink plenty of fluids  PAIN MEDICATION USE AND EXPECTATIONS  You have likely been given narcotic medications to help control your pain.  After a traumatic event that results in an fracture (broken bone) with or without surgery, it is ok to use narcotic pain medications to help control one's pain.  We understand that everyone responds to pain differently and each individual patient will be evaluated on a regular basis for the continued need for narcotic medications. Ideally, narcotic medication use should last no more than 6-8 weeks (coinciding with fracture healing).   As a patient it is your responsibility as well to monitor narcotic medication use and report the amount and frequency you use these medications when you come to your office visit.   We would also advise that if you are using narcotic medications, you should take a dose prior to therapy to maximize you participation.  IF YOU ARE ON NARCOTIC MEDICATIONS IT IS NOT PERMISSIBLE TO OPERATE A MOTOR VEHICLE (MOTORCYCLE/CAR/TRUCK/MOPED) OR HEAVY MACHINERY DO NOT MIX NARCOTICS WITH OTHER CNS (CENTRAL NERVOUS SYSTEM) DEPRESSANTS SUCH AS ALCOHOL   POST-OPERATIVE OPIOID TAPER INSTRUCTIONS: It is important to wean off of your opioid medication as soon as possible. If you do not need pain medication after your surgery it is ok to stop day one. Opioids include: Codeine, Hydrocodone (Norco, Vicodin), Oxycodone (Percocet, oxycontin ) and hydromorphone  amongst others.  Long term and even short term use of opiods can cause: Increased pain response Dependence Constipation Depression Respiratory depression And more.  Withdrawal symptoms can include Flu like symptoms Nausea, vomiting And more Techniques to manage these symptoms Hydrate well Eat regular healthy meals Stay  active Use relaxation techniques(deep breathing, meditating, yoga)  Do Not substitute Alcohol to help with tapering If you have been on opioids for less than two weeks and do not have pain than it is ok to stop all together.  Plan to wean off of opioids This plan should start within one week post op of your fracture surgery  Maintain the same interval or time between taking each dose and first decrease the dose.  Cut the total daily intake of opioids by one tablet each day Next start to increase the time between doses. The last dose that should be eliminated is the evening dose.    STOP SMOKING OR USING NICOTINE PRODUCTS!!!!  As discussed nicotine severely impairs your body's ability to heal surgical and traumatic wounds but also impairs bone healing.  Wounds and bone heal by forming microscopic blood vessels (angiogenesis) and nicotine is a vasoconstrictor (essentially, shrinks blood vessels).  Therefore, if vasoconstriction occurs to these microscopic blood vessels they essentially disappear and are unable to deliver necessary nutrients to the healing tissue.  This is one modifiable factor that you can do to dramatically increase your chances of healing your injury.    (This means no smoking, no nicotine gum, patches, etc)  DO NOT USE NONSTEROIDAL ANTI-INFLAMMATORY DRUGS (NSAID'S)  Using products such as Advil  (ibuprofen ), Aleve  (naproxen ), Motrin  (ibuprofen ) for additional pain control during fracture healing can delay and/or prevent the healing response.  If you would like to take over the counter (OTC) medication, Tylenol  (acetaminophen ) is ok.  However, some narcotic medications that are given for pain control contain acetaminophen  as well. Therefore, you should not exceed more than 4000 mg of tylenol  in a day if you do not have liver disease.  Also note that there are may OTC medicines, such as cold medicines and allergy medicines that my contain tylenol  as well.  If you have any questions  about medications and/or interactions please ask your doctor/PA or your pharmacist.      ICE AND ELEVATE INJURED/OPERATIVE EXTREMITY  Using ice and elevating the injured extremity above your heart can help with swelling and pain control.  Icing in a pulsatile fashion, such as 20 minutes on and 20 minutes off, can be followed.    Do not place ice directly on skin. Make sure there is a barrier between to skin and the ice pack.    Using frozen items such as frozen peas works well as the conform nicely to the are that needs to be iced.  USE AN ACE WRAP OR TED HOSE FOR SWELLING CONTROL  In addition to icing and elevation, Ace wraps or TED hose are used to help limit and resolve swelling.  It is recommended to use Ace wraps or TED hose until you are informed to stop.    When using Ace Wraps start the wrapping distally (farthest away from the body) and wrap proximally (closer to the body)   Example: If you had surgery on your leg and you do not have a splint on, start the ace wrap at the toes and work your way up to the thigh        If you had surgery on your upper extremity and do not have a splint on, start the ace wrap at your fingers and work your way up to the upper arm  IF YOU ARE IN A SPLINT OR CAST DO NOT REMOVE IT FOR ANY REASON   If your splint gets wet for any reason please contact the office immediately. You may shower in your  splint or cast as long as you keep it dry.  This can be done by wrapping in a cast cover or garbage back (or similar)  Do Not stick any thing down your splint or cast such as pencils, money, or hangers to try and scratch yourself with.  If you feel itchy take benadryl  as prescribed on the bottle for itching  IF YOU ARE IN A CAM BOOT (BLACK BOOT)  You may remove boot periodically. Perform daily dressing changes as noted below.  Wash the liner of the boot regularly and wear a sock when wearing the boot. It is recommended that you sleep in the boot until told  otherwise    Call office for the following: Temperature greater than 101F Persistent nausea and vomiting Severe uncontrolled pain Redness, tenderness, or signs of infection (pain, swelling, redness, odor or green/yellow discharge around the site) Difficulty breathing, headache or visual disturbances Hives Persistent dizziness or light-headedness Extreme fatigue Any other questions or concerns you may have after discharge  In an emergency, call 911 or go to an Emergency Department at a nearby hospital  HELPFUL INFORMATION  If you had a block, it will wear off between 8-24 hrs postop typically.  This is period when your pain may go from nearly zero to the pain you would have had postop without the block.  This is an abrupt transition but nothing dangerous is happening.  You may take an extra dose of narcotic when this happens.  You should wean off your narcotic medicines as soon as you are able.  Most patients will be off or using minimal narcotics before their first postop appointment.   We suggest you use the pain medication the first night prior to going to bed, in order to ease any pain when the anesthesia wears off. You should avoid taking pain medications on an empty stomach as it will make you nauseous.  Do not drink alcoholic beverages or take illicit drugs when taking pain medications.  In most states it is against the law to drive while you are in a splint or sling.  And certainly against the law to drive while taking narcotics.  You may return to work/school in the next couple of days when you feel up to it.   Pain medication may make you constipated.  Below are a few solutions to try in this order: Decrease the amount of pain medication if you arent having pain. Drink lots of decaffeinated fluids. Drink prune juice and/or each dried prunes  If the first 3 dont work start with additional solutions Take Colace - an over-the-counter stool softener Take Senokot - an  over-the-counter laxative Take Miralax  - a stronger over-the-counter laxative     CALL THE OFFICE WITH ANY QUESTIONS OR CONCERNS: [phone]   VISIT OUR WEBSITE FOR ADDITIONAL INFORMATION: [email or web address]      "

## 2024-12-25 NOTE — Anesthesia Postprocedure Evaluation (Signed)
"   Anesthesia Post Note  Patient: Karla Nichols  Procedure(s) Performed: OPEN REPAIR MALUNION LEFT ULNAR FRACTURE WITH LEFT FEMUR RIA (Left)     Patient location during evaluation: PACU Anesthesia Type: General Level of consciousness: awake and alert Pain management: pain level controlled Vital Signs Assessment: post-procedure vital signs reviewed and stable Respiratory status: spontaneous breathing, nonlabored ventilation, respiratory function stable and patient connected to nasal cannula oxygen Cardiovascular status: blood pressure returned to baseline and stable Postop Assessment: no apparent nausea or vomiting Anesthetic complications: no   No notable events documented.  Last Vitals:  Vitals:   12/25/24 1100 12/25/24 1115  BP: 117/78 118/75  Pulse: 86 92  Resp: 12 15  Temp:  36.7 C  SpO2: 100% 100%    Last Pain:  Vitals:   12/25/24 1115  TempSrc:   PainSc: 3                  Tavien Chestnut L Eisen Robenson      "

## 2024-12-25 NOTE — Op Note (Signed)
 12/25/2024  10:18 AM  PATIENT:  Karla Nichols  39 y.o. 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 OPEN REPAIR NONUNION LEFT ULNAR FRACTURE WITH AUTOGRAFT USING LEFT FEMUR REAMED INTRAMEDULLARY ASPIRATION 2.  Removal of antibiotic spacer  SURGEON:  Surgeons and Role:    DEWAINE Celena Sharper, MD - Primary  PHYSICIAN ASSISTANT: Francis Mt, PA-C  ANESTHESIA:   general  I/O:  Total I/O In: 900 [I.V.:800; IV Piggyback:100] Out: -   SPECIMEN:  No Specimen  TOURNIQUET:  * Missing tourniquet times found for documented tourniquets in log: [id number] *  COMPLICATIONS: NONE  DICTATION: .Note written in EPIC  DISPOSITION: TO PACU  CONDITION: STABLE  DELAY START OF DVT PROPHYLAXIS BECAUSE OF BLEEDING RISK: NO  Brief summary of indications for procedure: Patient is a very pleasant 39 year old right-hand-dominant female with a long history of left forearm nonunions.  She underwent nonunion repair with dual plating and placement of an antibiotic spacer little over 6 weeks ago.  She now presents for staged reamed intramedullary aspiration autografting with removal of the spacer, using the Masculet technique.  I discussed with her the risk and benefits of surgery include the potential for persistent nonunion infection femur fracture bleeding nerve or vessel injury and others.  After acknowledging these risks he provided consent to proceed.  Brief summary of procedure: Patient was given preoperative antibiotics taken operating room where general seizure was induced the left upper and left lower extremities were prepped and draped in usual sterile fashion no tourniquet was used during the procedure.  After chlorhexidine  wash Betadine  scrub and paint and draping, a timeout was held.  I brought in C arm to localize the cement spacer along the ulna.  I remade the  incision through the old surgical scar and carried dissection carefully down to the lateral aspect of the ulna carefully dividing each layer until I was at the membrane which had formed over the antibiotic spacer.  This membrane was incised longitudinally and carefully protected.  Using an osteotome I was able in piecemeal fashion to remove the cement spacer.  I confirmed complete removal with fluoroscopy and then packed the cavity with a Ray-Tec sponge soaked in saline.  C arm was then brought in and I identified the proper starting point proximal to the greater trochanter and advanced a threaded guidewire into the proximal femur confirming its position on orthogonal views.  This was followed by the entry reamer and then advancement of the ball-tipped guidewire into the distal aspect of the knee where its position was confirmed on orthogonal views.  The cortex was then carefully measured and found to best fit a 12-minute millimeter aspirator reamer.  With saline and suction engaged on the reamer aspirator it was then advanced slowly through the femur harvesting graft.  My assistant held the leg adducted and slightly flexed to facilitate passage.  We were able to collect excellent quality and sufficient quantity (though less than expected) graft and the 12 mm reamer was the perfect size being the maximum that we could advance through the canal.  Attention was then turned back to the ulna where the sponge was removed irrigation performed once more and then the cavity fully packed with the autograft obtained from the femur.  The membrane was carefully reapproximated using figure-of-eight 0 Vicryl suture.  The superficial layer  was then reapproximated as well and then the subcu with 2-0 Vicryl and the skin with nylon.  The hip wound was irrigated and closed in standard layered fashion with 2-0 Vicryl and 2-0 nylon also.  Francis Mt, PA-C was present assisting throughout and assistant was necessary to control the  femur during graft harvest and he also assisted with exposure of the ulna nonunion for removal of the spacer and closure.  Sterile gently compressive dressings were applied to all the wounds the patient was awakened anesthesia and transported the PACU stable condition.  Prognosis: Patient will be weightbearing as tolerated some bruising would be expected around the left hip no formal range of motion restrictions regarding the left forearm but again just minimizing activity to tolerance.  Will see her back in the office removal of sutures in 2 weeks.  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 Eldonna next month.

## 2024-12-25 NOTE — Transfer of Care (Signed)
 Immediate Anesthesia Transfer of Care Note  Patient: Karla Nichols  Procedure(s) Performed: OPEN REPAIR MALUNION LEFT ULNAR FRACTURE WITH LEFT FEMUR RIA (Left)  Patient Location: PACU  Anesthesia Type:General  Level of Consciousness: awake, alert , and oriented  Airway & Oxygen Therapy: Patient Spontanous Breathing  Post-op Assessment: Report given to RN and Post -op Vital signs reviewed and stable  Post vital signs: Reviewed and stable  Last Vitals:  Vitals Value Taken Time  BP 120/85 12/25/24 10:11  Temp    Pulse 90 12/25/24 10:14  Resp 14 12/25/24 10:14  SpO2 99 % 12/25/24 10:14  Vitals shown include unfiled device data.  Last Pain:  Vitals:   12/25/24 0641  TempSrc: (P) Oral         Complications: No notable events documented.

## 2024-12-25 NOTE — Anesthesia Procedure Notes (Signed)
 Procedure Name: Intubation Date/Time: 12/25/2024 8:12 AM  Performed by: Vertie Arthea RAMAN, CRNAPre-anesthesia Checklist: Patient identified, Emergency Drugs available, Suction available and Patient being monitored Patient Re-evaluated:Patient Re-evaluated prior to induction Oxygen Delivery Method: Circle System Utilized Preoxygenation: Pre-oxygenation with 100% oxygen Induction Type: IV induction Ventilation: Mask ventilation without difficulty Laryngoscope Size: Miller and 2 Grade View: Grade I Tube type: Oral Tube size: 7.0 mm Number of attempts: 1 Airway Equipment and Method: Stylet and Oral airway Placement Confirmation: ETT inserted through vocal cords under direct vision, positive ETCO2 and breath sounds checked- equal and bilateral Tube secured with: Tape Dental Injury: Teeth and Oropharynx as per pre-operative assessment

## 2024-12-26 ENCOUNTER — Encounter (HOSPITAL_COMMUNITY): Payer: Self-pay | Admitting: Orthopedic Surgery

## 2025-01-13 ENCOUNTER — Inpatient Hospital Stay: Attending: Psychiatry

## 2025-01-20 ENCOUNTER — Inpatient Hospital Stay (HOSPITAL_COMMUNITY): Admit: 2025-01-20 | Admitting: Psychiatry

## 2025-01-20 DIAGNOSIS — R1900 Intra-abdominal and pelvic swelling, mass and lump, unspecified site: Secondary | ICD-10-CM

## 2025-01-20 DIAGNOSIS — D219 Benign neoplasm of connective and other soft tissue, unspecified: Secondary | ICD-10-CM

## 2025-01-20 DIAGNOSIS — R971 Elevated cancer antigen 125 [CA 125]: Secondary | ICD-10-CM
